# Patient Record
Sex: Female | Born: 1945
Health system: Southern US, Community
[De-identification: ages and names within clinical notes are randomized; demographics above are authoritative.]

## PROBLEM LIST (undated history)

## (undated) DIAGNOSIS — M5410 Radiculopathy, site unspecified: Secondary | ICD-10-CM

## (undated) DIAGNOSIS — N189 Chronic kidney disease, unspecified: Secondary | ICD-10-CM

## (undated) DIAGNOSIS — F32A Depression, unspecified: Secondary | ICD-10-CM

## (undated) DIAGNOSIS — E669 Obesity, unspecified: Secondary | ICD-10-CM

## (undated) DIAGNOSIS — M5412 Radiculopathy, cervical region: Secondary | ICD-10-CM

## (undated) DIAGNOSIS — C9310 Chronic myelomonocytic leukemia not having achieved remission: Secondary | ICD-10-CM

## (undated) DIAGNOSIS — E785 Hyperlipidemia, unspecified: Secondary | ICD-10-CM

## (undated) DIAGNOSIS — F329 Major depressive disorder, single episode, unspecified: Secondary | ICD-10-CM

## (undated) DIAGNOSIS — I4891 Unspecified atrial fibrillation: Secondary | ICD-10-CM

## (undated) DIAGNOSIS — I10 Essential (primary) hypertension: Secondary | ICD-10-CM

## (undated) HISTORY — DX: Chronic kidney disease, unspecified: N18.9

## (undated) HISTORY — DX: Hyperlipidemia, unspecified: E78.5

## (undated) HISTORY — DX: Obesity, unspecified: E66.9

## (undated) HISTORY — PX: KNEE SURGERY: SHX244

## (undated) HISTORY — PX: ABDOMINAL HYSTERECTOMY: SHX81

## (undated) HISTORY — PX: CHOLECYSTECTOMY, LAPAROSCOPIC: SHX56

## (undated) HISTORY — DX: Radiculopathy, cervical region: M54.12

## (undated) HISTORY — DX: Essential (primary) hypertension: I10

## (undated) HISTORY — DX: Depression, unspecified: F32.A

## (undated) HISTORY — DX: Unspecified atrial fibrillation: I48.91

## (undated) HISTORY — DX: Radiculopathy, site unspecified: M54.10

## (undated) HISTORY — PX: HERNIA REPAIR: SHX51

## (undated) HISTORY — DX: Chronic myelomonocytic leukemia not having achieved remission: C93.10

## (undated) HISTORY — DX: Major depressive disorder, single episode, unspecified: F32.9

---

## 2009-01-28 HISTORY — PX: CARDIOVERSION: SHX1299

## 2009-07-03 ENCOUNTER — Ambulatory Visit: Payer: Self-pay | Admitting: Cardiology

## 2009-07-07 ENCOUNTER — Encounter: Payer: Self-pay | Admitting: Cardiology

## 2009-07-07 ENCOUNTER — Ambulatory Visit: Payer: Self-pay | Admitting: Cardiology

## 2009-07-10 ENCOUNTER — Encounter: Payer: Self-pay | Admitting: Cardiology

## 2009-07-10 ENCOUNTER — Telehealth: Payer: Self-pay | Admitting: Cardiology

## 2009-07-11 ENCOUNTER — Encounter: Payer: Self-pay | Admitting: Cardiology

## 2009-07-14 ENCOUNTER — Encounter: Payer: Self-pay | Admitting: Cardiology

## 2009-07-14 ENCOUNTER — Telehealth: Payer: Self-pay | Admitting: Cardiology

## 2009-07-19 ENCOUNTER — Encounter: Payer: Self-pay | Admitting: Cardiology

## 2009-07-19 LAB — CONVERTED CEMR LAB

## 2009-07-21 ENCOUNTER — Encounter: Payer: Self-pay | Admitting: Cardiology

## 2009-07-25 ENCOUNTER — Encounter: Payer: Self-pay | Admitting: Cardiology

## 2009-07-25 ENCOUNTER — Telehealth: Payer: Self-pay | Admitting: Cardiology

## 2009-07-25 ENCOUNTER — Telehealth (INDEPENDENT_AMBULATORY_CARE_PROVIDER_SITE_OTHER): Payer: Self-pay | Admitting: *Deleted

## 2009-07-25 LAB — CONVERTED CEMR LAB: POC INR: 4.3

## 2009-07-26 ENCOUNTER — Encounter: Payer: Self-pay | Admitting: Cardiology

## 2009-07-31 ENCOUNTER — Encounter: Payer: Self-pay | Admitting: Cardiology

## 2009-08-02 ENCOUNTER — Telehealth: Payer: Self-pay | Admitting: Cardiology

## 2009-08-02 ENCOUNTER — Encounter: Payer: Self-pay | Admitting: Cardiology

## 2009-08-02 LAB — CONVERTED CEMR LAB: POC INR: 3.1

## 2009-08-08 ENCOUNTER — Encounter: Payer: Self-pay | Admitting: Cardiology

## 2009-08-08 DIAGNOSIS — N189 Chronic kidney disease, unspecified: Secondary | ICD-10-CM | POA: Insufficient documentation

## 2009-08-08 DIAGNOSIS — E785 Hyperlipidemia, unspecified: Secondary | ICD-10-CM | POA: Insufficient documentation

## 2009-08-08 DIAGNOSIS — F341 Dysthymic disorder: Secondary | ICD-10-CM | POA: Insufficient documentation

## 2009-08-08 DIAGNOSIS — I4891 Unspecified atrial fibrillation: Secondary | ICD-10-CM | POA: Insufficient documentation

## 2009-08-09 ENCOUNTER — Encounter: Payer: Self-pay | Admitting: Cardiology

## 2009-08-09 ENCOUNTER — Ambulatory Visit: Payer: Self-pay | Admitting: Cardiology

## 2009-08-09 LAB — CONVERTED CEMR LAB: POC INR: 4.4

## 2009-08-14 ENCOUNTER — Encounter (INDEPENDENT_AMBULATORY_CARE_PROVIDER_SITE_OTHER): Payer: Self-pay | Admitting: *Deleted

## 2009-08-14 ENCOUNTER — Encounter: Payer: Self-pay | Admitting: Cardiology

## 2009-08-15 ENCOUNTER — Ambulatory Visit: Payer: Self-pay | Admitting: Cardiology

## 2009-08-15 LAB — CONVERTED CEMR LAB: POC INR: 4.5

## 2009-08-17 ENCOUNTER — Encounter: Payer: Self-pay | Admitting: Cardiology

## 2009-08-18 ENCOUNTER — Encounter: Payer: Self-pay | Admitting: Cardiology

## 2009-08-22 ENCOUNTER — Ambulatory Visit: Payer: Self-pay | Admitting: Cardiology

## 2009-08-25 ENCOUNTER — Ambulatory Visit: Payer: Self-pay | Admitting: Cardiology

## 2009-08-29 ENCOUNTER — Ambulatory Visit: Payer: Self-pay | Admitting: Cardiology

## 2009-09-06 ENCOUNTER — Encounter: Payer: Self-pay | Admitting: Cardiology

## 2009-09-08 ENCOUNTER — Ambulatory Visit: Payer: Self-pay | Admitting: Cardiology

## 2009-09-15 ENCOUNTER — Ambulatory Visit: Payer: Self-pay | Admitting: Cardiology

## 2009-09-15 LAB — CONVERTED CEMR LAB: POC INR: 2.5

## 2009-10-04 ENCOUNTER — Telehealth (INDEPENDENT_AMBULATORY_CARE_PROVIDER_SITE_OTHER): Payer: Self-pay | Admitting: *Deleted

## 2009-10-05 ENCOUNTER — Ambulatory Visit: Payer: Self-pay | Admitting: Cardiology

## 2009-10-05 LAB — CONVERTED CEMR LAB: POC INR: 2.6

## 2009-10-11 ENCOUNTER — Ambulatory Visit: Payer: Self-pay | Admitting: Cardiology

## 2009-10-11 ENCOUNTER — Encounter (INDEPENDENT_AMBULATORY_CARE_PROVIDER_SITE_OTHER): Payer: Self-pay | Admitting: *Deleted

## 2009-10-19 ENCOUNTER — Encounter: Payer: Self-pay | Admitting: Cardiology

## 2009-10-20 ENCOUNTER — Ambulatory Visit: Payer: Self-pay | Admitting: Cardiology

## 2009-10-23 ENCOUNTER — Encounter (INDEPENDENT_AMBULATORY_CARE_PROVIDER_SITE_OTHER): Payer: Self-pay | Admitting: *Deleted

## 2009-10-24 ENCOUNTER — Encounter (INDEPENDENT_AMBULATORY_CARE_PROVIDER_SITE_OTHER): Payer: Self-pay | Admitting: *Deleted

## 2009-10-27 ENCOUNTER — Ambulatory Visit: Payer: Self-pay | Admitting: Cardiology

## 2009-11-17 ENCOUNTER — Ambulatory Visit: Payer: Self-pay | Admitting: Cardiology

## 2009-12-19 ENCOUNTER — Ambulatory Visit: Payer: Self-pay | Admitting: Cardiology

## 2010-01-16 ENCOUNTER — Ambulatory Visit: Payer: Self-pay | Admitting: Cardiology

## 2010-02-13 ENCOUNTER — Ambulatory Visit: Admission: RE | Admit: 2010-02-13 | Discharge: 2010-02-13 | Payer: Self-pay | Source: Home / Self Care

## 2010-02-13 LAB — CONVERTED CEMR LAB: POC INR: 2.9

## 2010-02-27 NOTE — Progress Notes (Signed)
Summary: Changing appt to [place]  Phone Note Call from Patient Call back at [facility] Phone [phone]   Caller: Patient Reason for Call: Talk to Nurse Details for Reason: CCR In [place]??? Summary of Call: she would like to come to [place] tomorrow and get her coumadin checked intsead of Friday in [place].. She can not keep her appt on Friday. Initial call taken by: Claudette Laws,  October 04, 2009 2:04 PM  Follow-up for Phone Call        Appt changed to 10/05/09 at 3pm in [place].  Pt told and agreeable. Follow-up by: Vashti Hey RN,  October 04, 2009 2:36 PM

## 2010-02-27 NOTE — Progress Notes (Signed)
Summary: coumadin management  Phone Note Other Incoming   Caller: Alexis Frock, RN [facility] Reason for Call: Discuss lab or test results Summary of Call: Received call with results of INR obtained on pt today.  INR 3.7  Order given for pt hold coumadin tonight then decrease dose to 7.5mg  once daily.  Pt to stop Lovenox.  [facility] to recheck INR on 07/14/09 Initial call taken by: Vashti Hey RN,  July 10, 2009 11:50 AM     Anticoagulant Therapy  Managed by: Vashti Hey, RN Supervising MD: Dietrich Pates MD, Molly Maduro Indication 1: Atrial Fibrillation Lab Used: [facility] [place] Site: [place] INR POC 3.7  Dietary changes: no    Health status changes: no    Bleeding/hemorrhagic complications: no    Recent/future hospitalizations: no    Any changes in medication regimen? no    Recent/future dental: no  Any missed doses?: no       Is patient compliant with meds? yes         Anticoagulation Management History:      Her anticoagulation is being managed by telephone today.  Positive risk factors for bleeding include an age of 4 years or older.  The bleeding index is 'intermediate risk'.  Negative CHADS2 values include Age > 87 years old.  Anticoagulation responsible provider: Dietrich Pates MD, Molly Maduro.  INR POC: 3.7.    Anticoagulation Management Assessment/Plan:      The target INR is 2.0-3.0.  The next INR is due 07/14/2009.  Anticoagulation instructions were given to patient.  Results were reviewed/authorized by Vashti Hey, RN.  She was notified by Vashti Hey RN.         Prior Anticoagulation Instructions: INR 1.4 Increase comadin to 10mg  once daily  Continue Lovenox 150mg  subcutaneously [facility] to recheck INR on 07/10/09  Current Anticoagulation Instructions: Received call with results of INR obtained on pt today.  INR 3.7  Order given for pt hold coumadin tonight then decrease dose to 7.5mg  once daily.  Pt to stop Lovenox.  [facility] to recheck INR on 07/14/09

## 2010-02-27 NOTE — Medication Information (Signed)
Summary: ccr - pending dccv next week  --agh  Anticoagulant Therapy  Managed by: Vashti Hey, RN PCP: Lynden Oxford Supervising MD: Myrtis Ser MD, Tinnie Gens Indication 1: Atrial Fibrillation Lab Used: [facility] [place] Site: [place] INR POC 4.5 INR RANGE 2.0-3.0  Dietary changes: no    Health status changes: no    Bleeding/hemorrhagic complications: no    Recent/future hospitalizations: yes       Details: pending DCCV  Any changes in medication regimen? no    Recent/future dental: no  Any missed doses?: no       Is patient compliant with meds? yes       Allergies: No Known Drug Allergies  Anticoagulation Management History:      The patient is taking warfarin and comes in today for a routine follow up visit.  Positive risk factors for bleeding include an age of 67 years or older.  The bleeding index is 'intermediate risk'.  Negative CHADS2 values include Age > 34 years old.  Anticoagulation responsible provider: Myrtis Ser MD, Tinnie Gens.  INR POC: 4.5.  Cuvette Lot#: [id number].    Anticoagulation Management Assessment/Plan:      The patient's current anticoagulation dose is Coumadin 5 mg tabs: use as directed.  The target INR is 2.0-3.0.  The next INR is due 08/25/2009.  Anticoagulation instructions were given to patient.  Results were reviewed/authorized by Vashti Hey, RN.  She was notified by Vashti Hey RN.        Coagulation management information includes:  DCCV 08/22/09.  Prior Anticoagulation Instructions: INR 3.1 Continue coumadin 5mg  once daily except 7.5mg  on Sundays  Current Anticoagulation Instructions: Pt states INR was checked last week at [facility] on 7/13  INR 4 something.   Was told to take 2.5mg  that day then decrease dose to 5mg  once daily ( was not documented) INR 4.5 today Hold coumadin tonight then resume 5mg  once daily  Pending DCCV on 7/26

## 2010-02-27 NOTE — Miscellaneous (Signed)
Summary: Home Care Report/ [facility]  Home Care Report/ [facility]   Imported By: Dorise Hiss 07/10/2009 11:56:56  _____________________________________________________________________  External Attachment:    Type:   Image     Comment:   External Document
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
## 2010-02-27 NOTE — Letter (Signed)
Summary: Engineer, materials at [facility]  [address]   [place], [place] [address]   Phone: [phone]  Fax: [phone]        October 24, 2009 MRN: [id number]   Christina Bentley [address] [place], [place]  [address]   Dear Ms. Janczak,  Your test ordered by Selena Batten has been reviewed by your physician (or physician assistant) and was found to be normal or stable. Your physician (or physician assistant) felt no changes were needed at this time.  ____ Echocardiogram  __X__ Cardiac Stress Test  ____ Lab Work  ____ Peripheral vascular study of arms, legs or neck  ____ CT scan or X-ray  ____ Lung or Breathing test  ____ Other:   Thank you.   Hoover Brunette, LPN    Duane Boston, M.D., F.A.C.C. Thressa Sheller, M.D., F.A.C.C. Oneal Grout, M.D., F.A.C.C. Cheree Ditto, M.D., F.A.C.C. Daiva Nakayama, M.D., F.A.C.C. Kenney Houseman, M.D., F.A.C.C. Jeanne Ivan, PA-C

## 2010-02-27 NOTE — Medication Information (Signed)
Summary: ccr-lr  Anticoagulant Therapy  Managed by: Vashti Hey, RN PCP: Lynden Oxford Supervising MD: Diona Browner MD, Remi Deter Indication 1: Atrial Fibrillation Lab Used: [facility] [place] Site: [place] INR POC 3.4 INR RANGE 2.0-3.0  Dietary changes: no    Health status changes: no    Bleeding/hemorrhagic complications: no    Recent/future hospitalizations: no    Any changes in medication regimen? no    Recent/future dental: no  Any missed doses?: no       Is patient compliant with meds? yes       Allergies: No Known Drug Allergies  Anticoagulation Management History:      The patient is taking warfarin and comes in today for a routine follow up visit.  Positive risk factors for bleeding include an age of 67 years or older.  The bleeding index is 'intermediate risk'.  Negative CHADS2 values include Age > 88 years old.  Anticoagulation responsible provider: Diona Browner MD, Remi Deter.  INR POC: 3.4.  Cuvette Lot#: [id number].    Anticoagulation Management Assessment/Plan:      The patient's current anticoagulation dose is Coumadin 5 mg tabs: use as directed.  The target INR is 2.0-3.0.  The next INR is due 11/17/2009.  Anticoagulation instructions were given to patient.  Results were reviewed/authorized by Vashti Hey, RN.  She was notified by Vashti Hey RN.        Coagulation management information includes:  DCCV 08/22/09 Pt likes blood on the thinner side due to CVA Hx in family.  Prior Anticoagulation Instructions: INR 2.6 Increase coumadin to 5mg  once daily except 2.5mg  on Sundays Dose increased because pt feels more comfortable with INR's around 3.0  Current Anticoagulation Instructions: INR 3.4 Pt likes blood on the thinner side due to CVA Hx in family Continue coumadin 5mg  once daily except 2.5mg  on Sundays Increase greens

## 2010-02-27 NOTE — Medication Information (Signed)
Summary: ccr-lr  Anticoagulant Therapy  Managed by: Vashti Hey, RN PCP: Lynden Oxford Supervising MD: Diona Browner MD, Remi Deter Indication 1: Atrial Fibrillation Lab Used: [facility] [place] Site: [place] INR POC 2.9 INR RANGE 2.0-3.0  Dietary changes: no    Health status changes: no    Bleeding/hemorrhagic complications: no    Recent/future hospitalizations: no    Any changes in medication regimen? no    Recent/future dental: no  Any missed doses?: no       Is patient compliant with meds? yes       Allergies: No Known Drug Allergies  Anticoagulation Management History:      The patient is taking warfarin and comes in today for a routine follow up visit.  Positive risk factors for bleeding include an age of 71 years or older.  The bleeding index is 'intermediate risk'.  Negative CHADS2 values include Age > 100 years old.  Anticoagulation responsible Doniel Maiello: Diona Browner MD, Remi Deter.  INR POC: 2.9.  Cuvette Lot#: [id number].    Anticoagulation Management Assessment/Plan:      The patient's current anticoagulation dose is Coumadin 5 mg tabs: use as directed.  The target INR is 2.0-3.0.  The next INR is due 01/16/2010.  Anticoagulation instructions were given to patient.  Results were reviewed/authorized by Vashti Hey, RN.  She was notified by Vashti Hey RN.         Prior Anticoagulation Instructions: INR 2.8 Continue coumadin 5mg  once daily except 2.5mg  on Sundays  Current Anticoagulation Instructions: INR 2.9 Continue coumadin 5mg  once daily except 2.5mg  on Sundays

## 2010-02-27 NOTE — Letter (Signed)
Summary: Engineer, materials at [facility]  [address]   [place], [place] [address]   Phone: [phone]  Fax: [phone]        October 23, 2009 MRN: [id number]   Christina Bentley [address] [place], [place]  [address]   Dear Ms. Lawless,  Your test ordered by Selena Batten has been reviewed by your physician (or physician assistant) and was found to be normal or stable. Your physician (or physician assistant) felt no changes were needed at this time.  ____ Echocardiogram  ____ Cardiac Stress Test  __X__ Lab Work  ____ Peripheral vascular study of arms, legs or neck  ____ CT scan or X-ray  ____ Lung or Breathing test  ____ Other:   Thank you.   Hoover Brunette, LPN    Duane Boston, M.D., F.A.C.C. Thressa Sheller, M.D., F.A.C.C. Oneal Grout, M.D., F.A.C.C. Cheree Ditto, M.D., F.A.C.C. Daiva Nakayama, M.D., F.A.C.C. Kenney Houseman, M.D., F.A.C.C. Jeanne Ivan, PA-C

## 2010-02-27 NOTE — Progress Notes (Signed)
Summary: coumadin management  Phone Note Other Incoming   Caller: Alexis Frock RN [facility] Reason for Call: Discuss lab or test results Summary of Call: INR 4.3 today Hold coumadin tonight then decrease dose to 5mg  once daily except 7.5mg  on Sundays.  Recheck INR 08/01/09. Initial call taken by: Vashti Hey RN,  July 25, 2009 11:10 AM      Anticoagulation Management History:      Her anticoagulation is being managed by telephone today.  Positive risk factors for bleeding include an age of 33 years or older.  The bleeding index is 'intermediate risk'.  Negative CHADS2 values include Age > 40 years old.  Anticoagulation responsible provider: Andee Lineman MD, Michelle Piper.  INR POC: 4.3.    Anticoagulation Management Assessment/Plan:      The target INR is 2.0-3.0.  The next INR is due 08/01/2009.  Anticoagulation instructions were given to [facility]  Results were reviewed/authorized by Vashti Hey, RN.  She was notified by [facility].         Prior Anticoagulation Instructions: INR 4.5  Spoke with Byrd Hesselbach with [facility].  Skip today and tomorrow's dose of Coumadin then continue same dose of 1 1/2 tablet every day except 1 tablet on Monday and Friday.  Recheck in 1 week.   Current Anticoagulation Instructions: INR 4.3 Hold coumadin tonight then decrease dose to 5mg  once daily except 7.5mg  on Sundays.   Anticoagulant Therapy  Managed by: Vashti Hey, RN Supervising MD: Andee Lineman MD, Michelle Piper Indication 1: Atrial Fibrillation Lab Used: [facility] [place] Site: [place] INR POC 4.3 INR RANGE 2.0-3.0  Dietary changes: no    Health status changes: no    Bleeding/hemorrhagic complications: no    Recent/future hospitalizations: no    Any changes in medication regimen? no    Recent/future dental: no  Any missed doses?: no       Is patient compliant with meds? yes

## 2010-02-27 NOTE — Progress Notes (Signed)
Summary: Weakness w/AFib  Phone Note Other Incoming   Summary of Call: Christina Bentley with [facility] called stating pt seems to be in A.Fib with HR of 107. She is c/o weakness. Pt has first post hosp appt with Dr. Earnestine Leys on 7/13. Christina Bentley notified Dr. Earnestine Leys will be notified of pt's concerns but pt should go to ER for concerning symptoms. Otherwise, she will be evaulated at [facility] on 7/13. Christina Bentley verbalized understanding. Initial call taken by: Cyril Loosen, RN, BSN,  July 25, 2009 10:44 AM  Follow-up for Phone Call        agree. Follow-up by: Lewayne Bunting, MD, [facility],  July 25, 2009 6:15 PM

## 2010-02-27 NOTE — Medication Information (Signed)
Summary: ccr-lr  Anticoagulant Therapy  Managed by: Vashti Hey, RN PCP: Lynden Oxford Supervising MD: Myrtis Ser MD, Tinnie Gens Indication 1: Atrial Fibrillation Lab Used: [facility] [place] Site: [place] INR POC 2.6 INR RANGE 2.0-3.0  Dietary changes: no    Health status changes: no    Bleeding/hemorrhagic complications: no    Recent/future hospitalizations: no    Any changes in medication regimen? no    Recent/future dental: no  Any missed doses?: no       Is patient compliant with meds? yes       Allergies: No Known Drug Allergies  Anticoagulation Management History:      The patient is taking warfarin and comes in today for a routine follow up visit.  Positive risk factors for bleeding include an age of 67 years or older.  The bleeding index is 'intermediate risk'.  Negative CHADS2 values include Age > 26 years old.  Anticoagulation responsible Eliam Snapp: Myrtis Ser MD, Tinnie Gens.  INR POC: 2.6.  Cuvette Lot#: [id number].    Anticoagulation Management Assessment/Plan:      The patient's current anticoagulation dose is Coumadin 5 mg tabs: use as directed.  The target INR is 2.0-3.0.  The next INR is due 09/15/2009.  Anticoagulation instructions were given to patient.  Results were reviewed/authorized by Vashti Hey, RN.  She was notified by Vashti Hey RN.         Prior Anticoagulation Instructions: INR 3.5 Decrease coumadin to 5mg  once daily except 2.5mg  on S,T,Th  Current Anticoagulation Instructions: INR 2.6 Increase coumadin to 5mg  once daily except 2.5mg  on Sundays and Thursdays Per pt request.  She likes INR around 3.0 cause she is scared of a CVA

## 2010-02-27 NOTE — Medication Information (Signed)
Summary: ccr-lr  Anticoagulant Therapy  Managed by: Vashti Hey, RN PCP: Lynden Oxford Supervising MD: Diona Browner MD, Remi Deter Indication 1: Atrial Fibrillation Lab Used: [facility] [place] Site: [place] INR POC 3.5 INR RANGE 2.0-3.0           Allergies: No Known Drug Allergies  Anticoagulation Management History:      The patient is taking warfarin and comes in today for a routine follow up visit.  Positive risk factors for bleeding include an age of 67 years or older.  The bleeding index is 'intermediate risk'.  Negative CHADS2 values include Age > 67 years old.  Anticoagulation responsible provider: Diona Browner MD, Remi Deter.  INR POC: 3.5.  Cuvette Lot#: [id number].    Anticoagulation Management Assessment/Plan:      The patient's current anticoagulation dose is Coumadin 5 mg tabs: use as directed.  The target INR is 2.0-3.0.  The next INR is due 09/08/2009.  Anticoagulation instructions were given to patient.  Results were reviewed/authorized by Vashti Hey, RN.  She was notified by Vashti Hey RN.         Prior Anticoagulation Instructions: INR 4.1 Hold coumadin tonight then decrease dose to 5mg  once daily except 2.5mg  on Mondays  Current Anticoagulation Instructions: INR 3.5 Decrease coumadin to 5mg  once daily except 2.5mg  on S,T,Th

## 2010-02-27 NOTE — Medication Information (Signed)
Summary: [facility]  Anticoagulant Therapy  Managed by: Weston Brass, PharmD Supervising MD: Andee Lineman MD, Michelle Piper Indication 1: Atrial Fibrillation Lab Used: [facility] [place] Site: [place] INR POC 4.5 INR RANGE 2.0-3.0  Dietary changes: no    Health status changes: no    Bleeding/hemorrhagic complications: yes       Details: pt had cut on arm over weekend that bled more than usual. She was concerned so [facility] RN took INR today and it was elevated.   Recent/future hospitalizations: no    Any changes in medication regimen? no    Recent/future dental: no  Any missed doses?: no       Is patient compliant with meds? yes       Anticoagulation Management History:      Her anticoagulation is being managed by telephone today.  Positive risk factors for bleeding include an age of 68 years or older.  The bleeding index is 'intermediate risk'.  Negative CHADS2 values include Age > 19 years old.  Anticoagulation responsible provider: Andee Lineman MD, Michelle Piper.  INR POC: 4.5.    Anticoagulation Management Assessment/Plan:      The target INR is 2.0-3.0.  The next INR is due 07/25/2009.  Anticoagulation instructions were given to [facility].  Results were reviewed/authorized by Weston Brass, PharmD.  She was notified by Weston Brass PharmD.         Prior Anticoagulation Instructions: Called with results of PT/INR obtained on pt today.  PT 36.2  INR 3.0  Order given for pt to decrease coumadin to 7.5mg  once daily except 5mg  on Mondays and Fridays and [facility] to recheck INR on 07/24/09.  Current Anticoagulation Instructions: INR 4.5  Spoke with Byrd Hesselbach with [facility].  Skip today and tomorrow's dose of Coumadin then continue same dose of 1 1/2 tablet every day except 1 tablet on Monday and Friday.  Recheck in 1 week.

## 2010-02-27 NOTE — Miscellaneous (Signed)
Summary: Home Care Report/ [facility]  Home Care Report/ [facility]   Imported By: Dorise Hiss 07/25/2009 08:57:01  _____________________________________________________________________  External Attachment:    Type:   Image     Comment:   External Document

## 2010-02-27 NOTE — Letter (Signed)
Summary: Lexiscan or Dobutamine Pharmacist, community at [facility]  [address]   [place], [place] [address]   Phone: [phone]  Fax: [phone]      [facility] Cardiovascular Services  Lexiscan or Dobutamine Cardiolite Strss Test    Christina Bentley  Appointment Date:_  Appointment Time:_  Your doctor has ordered a CARDIOLITE STRESS TEST using a medication to stimulate exercise so that you will not have to walk on the treadmill to determine the condition of your heart during stress. If you take blood pressure medication, ask your doctor if you should take it the day of your test. You should not have anything to eat or drink at least 4 hours before your test is scheduled, and no caffeine, including decaffeinated tea and coffee, chocolate, and soft drinks for 24 hours before your test.  You will need to register at the Outpatient/Main Entrance at the hospital 15 minutes before your appointment time. It is a good idea to bring a copy of your order with you. They will direct you to the Diagnostic Imaging (Radiology) Department.  You will be asked to undress from the waist up and given a hospital gown to wear, so dress comfortably from the waist down for example: Sweat pants, shorts, or skirt Rubber soled lace up shoes (tennis shoes)  Plan on about three hours from registration to release from the hospital

## 2010-02-27 NOTE — Medication Information (Signed)
Summary: NEW CCR D/C [facility] 07-05-2009 PER GENE VS  Anticoagulant Therapy  Managed by: Vashti Hey, RN Supervising MD: Diona Browner MD, Remi Deter Indication 1: Atrial Fibrillation Lab Used: [facility] [place] Site: [place] INR POC 1.4  Dietary changes: no    Health status changes: no    Bleeding/hemorrhagic complications: no    Recent/future hospitalizations: yes       Details: In [facility] 07/03/09 - 07/05/09 for atrial fib  new onset  Any changes in medication regimen? yes       Details: She was started on coumadin in the hospital with lovenox bridge.  She has 5mg  tablet and was d/c on 7.5mg  qd   She is on Lovenox 150mg  sq qd   Husband is giving shots.  [facility] is seeing pt.  Recent/future dental: no  Any missed doses?: no       Is patient compliant with meds? yes      Comments: Coumadin teaching done in the hospital.  Pt's questions answered.  Anticoagulation Management History:      The patient comes in today for her initial visit for anticoagulation therapy.  Positive risk factors for bleeding include an age of 67 years or older.  The bleeding index is 'intermediate risk'.  Negative CHADS2 values include Age > 67 years old.  Anticoagulation responsible provider: Diona Browner MD, Remi Deter.  INR POC: 1.4.  Cuvette Lot#: [id number].    Anticoagulation Management Assessment/Plan:      The target INR is 2.0-3.0.  The next INR is due 07/10/2009.  Anticoagulation instructions were given to patient.  Results were reviewed/authorized by Vashti Hey, RN.  She was notified by Vashti Hey RN.         Current Anticoagulation Instructions: INR 1.4 Increase comadin to 10mg  once daily  Continue Lovenox 150mg  subcutaneously [facility] to recheck INR on 07/10/09

## 2010-02-27 NOTE — Consult Note (Signed)
Summary: CARDIOLOGY CONSULT/ [facility]  CARDIOLOGY CONSULT/ [facility]   Imported By: Zachary George 08/08/2009 16:54:24  _____________________________________________________________________  External Attachment:    Type:   Image     Comment:   External Document

## 2010-02-27 NOTE — Medication Information (Signed)
Summary: ccr-lr  Anticoagulant Therapy  Managed by: Vashti Hey, RN PCP: Lynden Oxford Supervising MD: Dietrich Pates MD, Molly Maduro Indication 1: Atrial Fibrillation Lab Used: [facility] [place] Site: [place] INR POC 2.6 INR RANGE 2.0-3.0  Dietary changes: no    Health status changes: no    Bleeding/hemorrhagic complications: no    Recent/future hospitalizations: no    Any changes in medication regimen? no    Recent/future dental: no  Any missed doses?: no       Is patient compliant with meds? yes       Allergies: No Known Drug Allergies  Anticoagulation Management History:      The patient is taking warfarin and comes in today for a routine follow up visit.  Positive risk factors for bleeding include an age of 67 years or older.  The bleeding index is 'intermediate risk'.  Negative CHADS2 values include Age > 14 years old.  Anticoagulation responsible provider: Dietrich Pates MD, Molly Maduro.  INR POC: 2.6.  Cuvette Lot#: [id number].    Anticoagulation Management Assessment/Plan:      The patient's current anticoagulation dose is Coumadin 5 mg tabs: use as directed.  The target INR is 2.0-3.0.  The next INR is due 10/27/2009.  Anticoagulation instructions were given to patient.  Results were reviewed/authorized by Vashti Hey, RN.  She was notified by Vashti Hey RN.         Prior Anticoagulation Instructions: INR 2.5 Continue coumadin 5mg  once daily except 2.5mg  on Sundays and Thursdays   Current Anticoagulation Instructions: INR 2.6 Increase coumadin to 5mg  once daily except 2.5mg  on Sundays Dose increased because pt feels more comfortable with INR's around 3.0
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
## 2010-02-27 NOTE — Miscellaneous (Signed)
Summary: Home Care Report/ [facility]  Home Care Report/ [facility]   Imported By: Dorise Hiss 08/07/2009 09:03:29  _____________________________________________________________________  External Attachment:    Type:   Image     Comment:   External Document

## 2010-02-27 NOTE — Miscellaneous (Signed)
Summary: Home Care Report/ [facility]  Home Care Report/ [facility]   Imported By: Dorise Hiss 09/13/2009 15:58:16  _____________________________________________________________________  External Attachment:    Type:   Image     Comment:   External Document

## 2010-02-27 NOTE — Miscellaneous (Signed)
Summary: Home Care Report/ [facility]  Home Care Report/ [facility]   Imported By: Dorise Hiss 08/09/2009 16:47:51  _____________________________________________________________________  External Attachment:    Type:   Image     Comment:   External Document

## 2010-02-27 NOTE — Miscellaneous (Signed)
Summary: Home Care Report/ [facility]  Home Care Report/ [facility]   Imported By: Dorise Hiss 07/27/2009 16:15:20  _____________________________________________________________________  External Attachment:    Type:   Image     Comment:   External Document

## 2010-02-27 NOTE — Medication Information (Signed)
Summary: ccr-lr  Anticoagulant Therapy  Managed by: Vashti Hey, RN PCP: Lynden Oxford Supervising MD: Diona Browner MD, Remi Deter Indication 1: Atrial Fibrillation Lab Used: [facility] [place] Site: [place] INR POC 4.1 INR RANGE 2.0-3.0  Dietary changes: no    Health status changes: no    Bleeding/hemorrhagic complications: no    Recent/future hospitalizations: yes       Details: S/P DCCV 08/22/09  Any changes in medication regimen? no    Recent/future dental: no  Any missed doses?: no       Is patient compliant with meds? yes       Allergies: No Known Drug Allergies  Anticoagulation Management History:      The patient is taking warfarin and comes in today for a routine follow up visit.  Positive risk factors for bleeding include an age of 67 years or older.  The bleeding index is 'intermediate risk'.  Negative CHADS2 values include Age > 91 years old.  Anticoagulation responsible provider: Diona Browner MD, Remi Deter.  INR POC: 4.1.  Cuvette Lot#: [id number].    Anticoagulation Management Assessment/Plan:      The patient's current anticoagulation dose is Coumadin 5 mg tabs: use as directed.  The target INR is 2.0-3.0.  The next INR is due 08/29/2009.  Anticoagulation instructions were given to patient.  Results were reviewed/authorized by Vashti Hey, RN.  She was notified by Vashti Hey RN.         Prior Anticoagulation Instructions: Pt states INR was checked last week at [facility] on 7/13  INR 4 something.   Was told to take 2.5mg  that day then decrease dose to 5mg  once daily ( was not documented) INR 4.5 today Hold coumadin tonight then resume 5mg  once daily  Pending DCCV on 7/26  Current Anticoagulation Instructions: INR 4.1 Hold coumadin tonight then decrease dose to 5mg  once daily except 2.5mg  on Mondays

## 2010-02-27 NOTE — Letter (Signed)
Summary: Discharge Summary  Discharge Summary   Imported By: Dorise Hiss 08/09/2009 09:11:59  _____________________________________________________________________  External Attachment:    Type:   Image     Comment:   External Document

## 2010-02-27 NOTE — Progress Notes (Signed)
Summary: coumadin management  Phone Note Other Incoming   Caller: Okey Regal RN [facility] Reason for Call: Discuss lab or test results Summary of Call: Called with results of PT/INR obtained on pt today.  PT 36.2  INR 3.0  Order given for pt to decrease coumadin to 7.5mg  once daily except 5mg  on Mondays and Fridays and [facility] to recheck INR on 07/24/09. Initial call taken by: Vashti Hey RN,  July 14, 2009 4:05 PM     Anticoagulant Therapy  Managed by: Vashti Hey, RN Supervising MD: Diona Browner MD, Remi Deter Indication 1: Atrial Fibrillation Lab Used: [facility] [place] Site: [place] PT 36.2 INR POC 3.0  Dietary changes: no    Health status changes: no    Bleeding/hemorrhagic complications: no    Recent/future hospitalizations: no    Any changes in medication regimen? no    Recent/future dental: no  Any missed doses?: no       Is patient compliant with meds? yes         Anticoagulation Management History:      Her anticoagulation is being managed by telephone today.  Positive risk factors for bleeding include an age of 67 years or older.  The bleeding index is 'intermediate risk'.  Negative CHADS2 values include Age > 67 years old.  Prothrombin time is 36.2.  Anticoagulation responsible provider: Diona Browner MD, Remi Deter.  INR POC: 3.0.    Anticoagulation Management Assessment/Plan:      The target INR is 2.0-3.0.  The next INR is due 07/24/2009.  Anticoagulation instructions were given to [facility].  Results were reviewed/authorized by Vashti Hey, RN.  She was notified by [facility].         Prior Anticoagulation Instructions: Received call with results of INR obtained on pt today.  INR 3.7  Order given for pt hold coumadin tonight then decrease dose to 7.5mg  once daily.  Pt to stop Lovenox.  [facility] to recheck INR on 07/14/09  Current Anticoagulation Instructions: Called with results of PT/INR obtained on pt today.  PT 36.2  INR 3.0  Order given for pt to decrease coumadin to  7.5mg  once daily except 5mg  on Mondays and Fridays and [facility] to recheck INR on 07/24/09.

## 2010-02-27 NOTE — Medication Information (Signed)
Summary: Christina Bentley  Anticoagulant Therapy  Managed by: Vashti Hey, RN PCP: Lynden Oxford Supervising MD: Andee Lineman MD, Michelle Piper Indication 1: Atrial Fibrillation Lab Used: [facility] [place] Site: [place] INR POC 2.5 INR RANGE 2.0-3.0  Dietary changes: no    Health status changes: no    Bleeding/hemorrhagic complications: no    Recent/future hospitalizations: no    Any changes in medication regimen? no    Recent/future dental: no  Any missed doses?: no       Is patient compliant with meds? yes       Allergies: No Known Drug Allergies  Anticoagulation Management History:      The patient is taking warfarin and comes in today for a routine follow up visit.  Positive risk factors for bleeding include an age of 11 years or older.  The bleeding index is 'intermediate risk'.  Negative CHADS2 values include Age > 36 years old.  Anticoagulation responsible Jamaurion Slemmer: Andee Lineman MD, Michelle Piper.  INR POC: 2.5.  Cuvette Lot#: [id number].    Anticoagulation Management Assessment/Plan:      The patient's current anticoagulation dose is Coumadin 5 mg tabs: use as directed.  The target INR is 2.0-3.0.  The next INR is due 10/06/2009.  Anticoagulation instructions were given to patient.  Results were reviewed/authorized by Vashti Hey, RN.  She was notified by Vashti Hey RN.         Prior Anticoagulation Instructions: INR 2.6 Increase coumadin to 5mg  once daily except 2.5mg  on Sundays and Thursdays Per pt request.  She likes INR around 3.0 cause she is scared of a CVA  Current Anticoagulation Instructions: INR 2.5 Continue coumadin 5mg  once daily except 2.5mg  on Sundays and Thursdays

## 2010-02-27 NOTE — Letter (Signed)
Summary: Cardioversion/TEE Cardioversion Letter  Architectural technologist at [facility]   [place], [place] [address]   Phone: [phone]  Fax: [phone]    08/14/2009 MRN: [id number]  Christina Bentley [address] [place], [place]  [address]    You are scheduled for a Cardioversion on Tuesday, July 26 at 11:30 with Dr. Andee Lineman.   Please arrive at [facility] at [facility] at 9:30 a.m. on the day of your procedure.  1)   DIET:  A)  Nothing to eat or drink after midnight except your medications with a sip of water.  B)   May have clear liquid breakfast, then nothing to eat or drink after _________ a.m. / p.m.      Clear liquids include:  water, broth, Sprite, Ginger Ale, black coffee, tea (no sugar),      cranberry / grape / apple juice, jello (not red), popsicle from clear juices (not red).  2)   [facility]______ at ____________ a.m./p.m. for your pre-procedure visit.  3)  MAKE SURE YOU TAKE YOUR COUMADIN.  4)   A)   DO NOT TAKE these medications before your procedure:      ___________________________________________________________________     ___________________________________________________________________     ___________________________________________________________________  B)   YOU MAY TAKE ALL of your remaining medications with a small amount of water.    C)   START NEW medications:       ___________________________________________________________________     ___________________________________________________________________  5)   Must have a responsible person to drive you home.  6)   Bring a current list of your medications and current insurance cards.   * Special Note:  Every effort is made to have your procedure done on time. Occasionally there are emergencies that present themselves at the hospital that may cause delays. Please be patient if a delay does occur.  * If you have any questions after you get home,  please call the office at [phone].

## 2010-02-27 NOTE — Miscellaneous (Signed)
Summary: Home Care Report/ [facility]  Home Care Report/ [facility]   Imported By: Dorise Hiss 07/18/2009 13:51:02  _____________________________________________________________________  External Attachment:    Type:   Image     Comment:   External Document

## 2010-02-27 NOTE — Progress Notes (Signed)
Summary: coumadin management  Phone Note From Other Clinic Call back at [phone]   Caller: Noreene Larsson with [facility]  Call For: nurse Summary of Call: message left on voicemail from homehealth nurse that patient refusing INR this week due to primary nurse off this week and patient refusing to let any other nurse do INR. Patient states she was coming to our coumadin clinic this week to have it checked. Nurse called homehealth nurse back and informed her that patient doesn't have an appointment at our [facility] clinic this week and she needed this since she was pending cardioversion per [facility]. Homehealth nurse informed nurse that she would have her supervisor call patient back and reinforce importance or getting INR checked weekly due to pending cardioversion.  Initial call taken by: Carlye Grippe,  August 02, 2009 4:43 PM  Follow-up for Phone Call        Home Health nurse called with results of INR obtained on pt today.  INR 3.1  Order given for pt to continue coumadin 5mg  once daily except 7.5mg  on Sundays.  [facility] discharged pt today.  Next INR will be in the office on 08/09/09 at appt with Dr Andee Lineman. Follow-up by: Vashti Hey RN,  August 03, 2009 3:18 PM     Anticoagulant Therapy  Managed by: Vashti Hey, RN Supervising MD: Diona Browner MD, Remi Deter Indication 1: Atrial Fibrillation Lab Used: [facility] [place] Site: [place] INR POC 3.1 INR RANGE 2.0-3.0  Dietary changes: no    Health status changes: no    Bleeding/hemorrhagic complications: no    Recent/future hospitalizations: yes       Details: ? pending DCCV  Any changes in medication regimen? no    Recent/future dental: no  Any missed doses?: no       Is patient compliant with meds? yes         Anticoagulation Management History:      Her anticoagulation is being managed by telephone today.  Positive risk factors for bleeding include an age of 67 years or older.  The bleeding index is 'intermediate risk'.   Negative CHADS2 values include Age > 67 years old.  Anticoagulation responsible provider: Diona Browner MD, Remi Deter.  INR POC: 3.1.    Anticoagulation Management Assessment/Plan:      The patient's current anticoagulation dose is Coumadin 5 mg tabs: use as directed.  The target INR is 2.0-3.0.  The next INR is due 08/09/2009.  Anticoagulation instructions were given to [facility].  Results were reviewed/authorized by Vashti Hey, RN.  She was notified by [facility].        Coagulation management information includes: ? DCCV.  Prior Anticoagulation Instructions: INR 4.3 Hold coumadin tonight then decrease dose to 5mg  once daily except 7.5mg  on Sundays.  Current Anticoagulation Instructions: INR 3.1 Continue coumadin 5mg  once daily except 7.5mg  on Sundays

## 2010-02-27 NOTE — Medication Information (Signed)
Summary: ccr-lr  Anticoagulant Therapy  Managed by: Vashti Hey, RN PCP: Lynden Oxford Supervising MD: Andee Lineman MD, Michelle Piper Indication 1: Atrial Fibrillation Lab Used: [facility] [place] Site: [place] INR POC 2.8 INR RANGE 2.0-3.0  Dietary changes: no    Health status changes: no    Bleeding/hemorrhagic complications: no    Recent/future hospitalizations: no    Any changes in medication regimen? no    Recent/future dental: no  Any missed doses?: no       Is patient compliant with meds? yes       Allergies: No Known Drug Allergies  Anticoagulation Management History:      The patient is taking warfarin and comes in today for a routine follow up visit.  Positive risk factors for bleeding include an age of 67 years or older.  The bleeding index is 'intermediate risk'.  Negative CHADS2 values include Age > 7 years old.  Anticoagulation responsible provider: Andee Lineman MD, Michelle Piper.  INR POC: 2.8.  Cuvette Lot#: [id number].    Anticoagulation Management Assessment/Plan:      The patient's current anticoagulation dose is Coumadin 5 mg tabs: use as directed.  The target INR is 2.0-3.0.  The next INR is due 12/15/2009.  Anticoagulation instructions were given to patient.  Results were reviewed/authorized by Vashti Hey, RN.  She was notified by Vashti Hey RN.         Prior Anticoagulation Instructions: INR 3.4 Pt likes blood on the thinner side due to CVA Hx in family Continue coumadin 5mg  once daily except 2.5mg  on Sundays Increase greens   Current Anticoagulation Instructions: INR 2.8 Continue coumadin 5mg  once daily except 2.5mg  on Sundays

## 2010-02-27 NOTE — Miscellaneous (Signed)
Summary: Orders Update - DCCV  Clinical Lists Changes  Orders: Added new Referral order of Cardioversion (Cardioversion) - Signed Added new Test order of T-Basic Metabolic Panel ([id number]) - Signed Added new Test order of T-Protime, Auto ([id number]) - Signed 

## 2010-02-27 NOTE — Medication Information (Signed)
Summary: Coumadin Clinic  Anticoagulant Therapy  Managed by: Hoover Brunette, LPN PCP: Lynden Oxford Supervising MD: Diona Browner MD, Remi Deter Indication 1: Atrial Fibrillation Lab Used: [facility] [place] Site: [place] INR POC 4.4 INR RANGE 2.0-3.0  Dietary changes: no    Health status changes: no    Bleeding/hemorrhagic complications: no    Recent/future hospitalizations: yes       Details: pending DCCV  Any changes in medication regimen? no    Recent/future dental: no  Any missed doses?: no       Is patient compliant with meds? yes       Allergies: No Known Drug Allergies  Anticoagulation Management History:      The patient is taking warfarin and comes in today for a routine follow up visit.  Positive risk factors for bleeding include an age of 67 years or older.  The bleeding index is 'intermediate risk'.  Negative CHADS2 values include Age > 42 years old.  Anticoagulation responsible provider: Myrtis Ser MD, Tinnie Gens.  INR POC: 4.5.  Cuvette Lot#: [id number].    Anticoagulation Management Assessment/Plan:      The patient's current anticoagulation dose is Coumadin 5 mg tabs: use as directed.  The target INR is 2.0-3.0.  The next INR is due 08/25/2009.  Anticoagulation instructions were given to patient.  Results were reviewed/authorized by Vashti Hey, RN.  She was notified by Hoover Brunette, LPN.         Prior Anticoagulation Instructions: Pt states INR was checked last week at [facility] on 7/13  INR 4 something.   Was told to take 2.5mg  that day then decrease dose to 5mg  once daily ( was not documented) INR 4.5 today Hold coumadin tonight then resume 5mg  once daily  Pending DCCV on 7/26  Current Anticoagulation Instructions: INR 4.4 Pt to take coumadin 2.5mg  tonight then resume 5mg  once daily

## 2010-03-01 NOTE — Medication Information (Signed)
Summary: ccr-lr  Anticoagulant Therapy  Managed by: Vashti Hey, RN PCP: Lynden Oxford Supervising MD: Andee Lineman MD, Michelle Piper Indication 1: Atrial Fibrillation Lab Used: [facility] [place] Site: [place] INR POC 2.9 INR RANGE 2.0-3.0  Dietary changes: no    Health status changes: no    Bleeding/hemorrhagic complications: no    Recent/future hospitalizations: no    Any changes in medication regimen? no    Recent/future dental: no  Any missed doses?: no       Is patient compliant with meds? yes       Allergies: No Known Drug Allergies  Anticoagulation Management History:      The patient is taking warfarin and comes in today for a routine follow up visit.  Positive risk factors for bleeding include an age of 67 years or older.  The bleeding index is 'intermediate risk'.  Negative CHADS2 values include Age > 67 years old.  Anticoagulation responsible T[place]on Enyeart: Andee Lineman MD, Michelle Piper.  INR POC: 2.9.  Cuvette Lot#: [id number].    Anticoagulation Management Assessment/Plan:      The patient's current anticoagulation dose is Coumadin 5 mg tabs: use as directed.  The target INR is 2.0-3.0.  The next INR is due 02/13/2010.  Anticoagulation instructions were given to patient.  Results were reviewed/authorized by Vashti Hey, RN.  She was notified by Vashti Hey RN.         Prior Anticoagulation Instructions: INR 2.9 Continue coumadin 5mg  once daily except 2.5mg  on Sundays  Current Anticoagulation Instructions: Same as Prior Instructions.

## 2010-03-01 NOTE — Medication Information (Signed)
Summary: ccr-lr  Anticoagulant Therapy  Managed by: Vashti Hey, RN PCP: Lynden Oxford Supervising MD: Diona Browner MD, Remi Deter Indication 1: Atrial Fibrillation Lab Used: [facility] [place] Site: [place] INR POC 2.9 INR RANGE 2.0-3.0  Dietary changes: no    Health status changes: no    Bleeding/hemorrhagic complications: no    Recent/future hospitalizations: no    Any changes in medication regimen? no    Recent/future dental: no  Any missed doses?: no       Is patient compliant with meds? yes       Allergies: No Known Drug Allergies  Anticoagulation Management History:      The patient is taking warfarin and comes in today for a routine follow up visit.  Positive risk factors for bleeding include an age of 30 years or older.  The bleeding index is 'intermediate risk'.  Negative CHADS2 values include Age > 55 years old.  Anticoagulation responsible provider: Diona Browner MD, Remi Deter.  INR POC: 2.9.  Cuvette Lot#: [id number].    Anticoagulation Management Assessment/Plan:      The patient's current anticoagulation dose is Coumadin 5 mg tabs: use as directed.  The target INR is 2.0-3.0.  The next INR is due 03/13/2010.  Anticoagulation instructions were given to patient.  Results were reviewed/authorized by Vashti Hey, RN.  She was notified by Vashti Hey RN.         Prior Anticoagulation Instructions: INR 2.9 Continue coumadin 5mg  once daily except 2.5mg  on Sundays  Current Anticoagulation Instructions: Same as Prior Instructions.

## 2010-03-09 ENCOUNTER — Telehealth (INDEPENDENT_AMBULATORY_CARE_PROVIDER_SITE_OTHER): Payer: Self-pay | Admitting: *Deleted

## 2010-03-13 ENCOUNTER — Encounter (INDEPENDENT_AMBULATORY_CARE_PROVIDER_SITE_OTHER): Payer: Medicare Other

## 2010-03-13 ENCOUNTER — Encounter: Payer: Self-pay | Admitting: Cardiology

## 2010-03-13 DIAGNOSIS — I4891 Unspecified atrial fibrillation: Secondary | ICD-10-CM

## 2010-03-13 DIAGNOSIS — Z7901 Long term (current) use of anticoagulants: Secondary | ICD-10-CM

## 2010-03-13 LAB — CONVERTED CEMR LAB: POC INR: 3.1

## 2010-03-15 NOTE — Progress Notes (Signed)
Summary: Med change  Phone Note Call from Patient   Reason for Call: Talk to Nurse Summary of Call: Saw Dr Teresa Pelton for leg pain and he started her on Hydrocodone 7.5/325mg  for pain.  Discussed possible interactions with this but strength has smaller amt of tylenol.  She will take as needed and has appt for INR on 03/13/10.  May need lap. surgery in the future on her knee.  Is pending MRI. Initial call taken by: Vashti Hey RN,  March 09, 2010 5:20 PM

## 2010-03-20 ENCOUNTER — Telehealth (INDEPENDENT_AMBULATORY_CARE_PROVIDER_SITE_OTHER): Payer: Self-pay | Admitting: *Deleted

## 2010-03-21 NOTE — Medication Information (Signed)
Summary: ccr-lr  Anticoagulant Therapy  Managed by: Vashti Hey, RN PCP: Lynden Oxford Supervising MD: Diona Browner MD, Remi Deter Indication 1: Atrial Fibrillation Lab Used: [facility] [place] Site: [place] INR POC 3.1 INR RANGE 2.0-3.0  Dietary changes: no    Health status changes: no    Bleeding/hemorrhagic complications: no    Recent/future hospitalizations: no    Any changes in medication regimen? no    Recent/future dental: no  Any missed doses?: no       Is patient compliant with meds? yes       Allergies: No Known Drug Allergies  Anticoagulation Management History:      The patient is taking warfarin and comes in today for a routine follow up visit.  Positive risk factors for bleeding include an age of 28 years or older.  The bleeding index is 'intermediate risk'.  Negative CHADS2 values include Age > 60 years old.  Anticoagulation responsible provider: Diona Browner MD, Remi Deter.  INR POC: 3.1.  Cuvette Lot#: [id number].    Anticoagulation Management Assessment/Plan:      The patient's current anticoagulation dose is Coumadin 5 mg tabs: use as directed.  The target INR is 2.0-3.0.  The next INR is due 04/13/2010.  Anticoagulation instructions were given to patient.  Results were reviewed/authorized by Vashti Hey, RN.  She was notified by Vashti Hey RN.         Prior Anticoagulation Instructions: INR 2.9 Continue coumadin 5mg  once daily except 2.5mg  on Sundays  Current Anticoagulation Instructions: INR 3.1 Continue coumadin 5mg  once daily except 2.5mg  on Sundays

## 2010-03-27 NOTE — Progress Notes (Signed)
Summary: arthroscopic knee surgery  Phone Note Call from Patient   Reason for Call: Talk to Nurse Summary of Call: Called to let me know Dr Teresa Pelton has scheduled her for arthroscopic knee surgey on 03/27/10.  He told her to stop her coumadin on 03/20/10.  She will resume after surgery with f/u INR as planned.  Encouraged pt to be as mobile as possible after surgery. Initial call taken by: Vashti Hey RN,  March 20, 2010 10:40 AM

## 2010-04-13 ENCOUNTER — Encounter: Payer: Self-pay | Admitting: Cardiology

## 2010-04-13 ENCOUNTER — Encounter (INDEPENDENT_AMBULATORY_CARE_PROVIDER_SITE_OTHER): Payer: Medicare Other

## 2010-04-13 DIAGNOSIS — Z7901 Long term (current) use of anticoagulants: Secondary | ICD-10-CM

## 2010-04-13 DIAGNOSIS — I4891 Unspecified atrial fibrillation: Secondary | ICD-10-CM

## 2010-04-13 LAB — CONVERTED CEMR LAB: POC INR: 2.7

## 2010-04-17 NOTE — Medication Information (Signed)
Summary: ccr-lr  Anticoagulant Therapy  Managed by: Vashti Hey, RN PCP: Lynden Oxford Supervising MD: Andee Lineman MD, Michelle Piper Indication 1: Atrial Fibrillation Lab Used: [facility] [place] Site: [place] INR POC 2.7 INR RANGE 2.0-3.0  Dietary changes: no    Health status changes: no    Bleeding/hemorrhagic complications: no    Recent/future hospitalizations: no    Any changes in medication regimen? no    Recent/future dental: no  Any missed doses?: no       Is patient compliant with meds? yes       Allergies: No Known Drug Allergies  Anticoagulation Management History:      The patient is taking warfarin and comes in today for a routine follow up visit.  Positive risk factors for bleeding include an age of 67 years or older.  The bleeding index is 'intermediate risk'.  Negative CHADS2 values include Age > 67 years old.  Anticoagulation responsible provider: Andee Lineman MD, Michelle Piper.  INR POC: 2.7.  Cuvette Lot#: [id number].    Anticoagulation Management Assessment/Plan:      The patient's current anticoagulation dose is Coumadin 5 mg tabs: use as directed.  The target INR is 2.0-3.0.  The next INR is due 05/08/2010.  Anticoagulation instructions were given to patient.  Results were reviewed/authorized by Vashti Hey, RN.  She was notified by Vashti Hey RN.         Prior Anticoagulation Instructions: INR 3.1 Continue coumadin 5mg  once daily except 2.5mg  on Sundays  Current Anticoagulation Instructions: INR 2.7 Continue coumadin 5mg  once daily except 2.5mg  on Sundays

## 2010-05-07 ENCOUNTER — Encounter: Payer: Self-pay | Admitting: Cardiology

## 2010-05-07 ENCOUNTER — Other Ambulatory Visit: Payer: Self-pay | Admitting: Cardiology

## 2010-05-07 DIAGNOSIS — Z7901 Long term (current) use of anticoagulants: Secondary | ICD-10-CM

## 2010-05-07 DIAGNOSIS — I4891 Unspecified atrial fibrillation: Secondary | ICD-10-CM

## 2010-05-08 ENCOUNTER — Ambulatory Visit (INDEPENDENT_AMBULATORY_CARE_PROVIDER_SITE_OTHER): Payer: Medicare Other | Admitting: *Deleted

## 2010-05-08 DIAGNOSIS — I4891 Unspecified atrial fibrillation: Secondary | ICD-10-CM

## 2010-05-08 DIAGNOSIS — Z7901 Long term (current) use of anticoagulants: Secondary | ICD-10-CM

## 2010-06-02 ENCOUNTER — Other Ambulatory Visit: Payer: Self-pay | Admitting: Cardiology

## 2010-06-05 ENCOUNTER — Other Ambulatory Visit: Payer: Self-pay | Admitting: Cardiology

## 2010-06-05 ENCOUNTER — Ambulatory Visit (INDEPENDENT_AMBULATORY_CARE_PROVIDER_SITE_OTHER): Payer: Medicare Other | Admitting: *Deleted

## 2010-06-05 DIAGNOSIS — Z7901 Long term (current) use of anticoagulants: Secondary | ICD-10-CM

## 2010-06-05 DIAGNOSIS — I4891 Unspecified atrial fibrillation: Secondary | ICD-10-CM

## 2010-06-17 ENCOUNTER — Other Ambulatory Visit: Payer: Self-pay | Admitting: Cardiology

## 2010-06-18 ENCOUNTER — Other Ambulatory Visit: Payer: Self-pay | Admitting: Cardiology

## 2010-06-21 ENCOUNTER — Ambulatory Visit (INDEPENDENT_AMBULATORY_CARE_PROVIDER_SITE_OTHER): Payer: Medicare Other | Admitting: Cardiology

## 2010-06-21 ENCOUNTER — Ambulatory Visit (INDEPENDENT_AMBULATORY_CARE_PROVIDER_SITE_OTHER): Payer: Medicare Other | Admitting: *Deleted

## 2010-06-21 ENCOUNTER — Encounter: Payer: Self-pay | Admitting: Cardiology

## 2010-06-21 DIAGNOSIS — I4891 Unspecified atrial fibrillation: Secondary | ICD-10-CM

## 2010-06-21 DIAGNOSIS — N189 Chronic kidney disease, unspecified: Secondary | ICD-10-CM

## 2010-06-21 DIAGNOSIS — E785 Hyperlipidemia, unspecified: Secondary | ICD-10-CM

## 2010-06-21 DIAGNOSIS — Z7901 Long term (current) use of anticoagulants: Secondary | ICD-10-CM

## 2010-06-21 DIAGNOSIS — R5383 Other fatigue: Secondary | ICD-10-CM | POA: Insufficient documentation

## 2010-06-21 MED ORDER — DILTIAZEM HCL ER COATED BEADS 240 MG PO CP24: 240.0000 mg | ORAL_CAPSULE | Freq: Every day | ORAL | Status: DC

## 2010-06-21 MED ORDER — WARFARIN SODIUM 5 MG PO TABS: ORAL_TABLET | ORAL | Status: DC

## 2010-06-21 NOTE — Patient Instructions (Signed)
Continue all current medications. Your physician wants you to follow up in:  1 year.  You will receive a reminder letter in the mail one-two months in advance.  If you don't receive a letter, please call our office to schedule the follow up appointment   

## 2010-06-21 NOTE — Assessment & Plan Note (Signed)
She tolerates anticoagulation. At this point no change in therapy is indicated.

## 2010-06-21 NOTE — Assessment & Plan Note (Signed)
We discussed at length her fatigue and possible etiologies. This is very likely at least in part related to decreased sleep. We discussed significant sleep strategies.
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
## 2010-06-21 NOTE — Assessment & Plan Note (Signed)
This was stable at the last appt.  I will defer follow up to TAPPER,DAVID B, MD

## 2010-06-21 NOTE — Assessment & Plan Note (Signed)
We have a very long discussion about her obesity.  For a change the patient was the one to initiate this discussion.  She wants to start the Atkins diet. I think her weight is such a significant problem that I would encourage this. We discussed long-term sustainable strategies as well.

## 2010-06-22 ENCOUNTER — Encounter: Payer: Medicare Other | Admitting: *Deleted

## 2010-06-22 ENCOUNTER — Ambulatory Visit: Payer: Medicare Other | Admitting: Cardiology

## 2010-06-22 LAB — POCT INR: INR: 3.3

## 2010-07-06 ENCOUNTER — Ambulatory Visit (INDEPENDENT_AMBULATORY_CARE_PROVIDER_SITE_OTHER): Payer: Medicare Other | Admitting: *Deleted

## 2010-07-06 DIAGNOSIS — Z7901 Long term (current) use of anticoagulants: Secondary | ICD-10-CM

## 2010-07-06 DIAGNOSIS — I4891 Unspecified atrial fibrillation: Secondary | ICD-10-CM

## 2010-07-06 LAB — POCT INR: INR: 2.8

## 2010-08-03 ENCOUNTER — Ambulatory Visit (INDEPENDENT_AMBULATORY_CARE_PROVIDER_SITE_OTHER): Payer: Medicare Other | Admitting: *Deleted

## 2010-08-03 DIAGNOSIS — I4891 Unspecified atrial fibrillation: Secondary | ICD-10-CM

## 2010-08-03 DIAGNOSIS — Z7901 Long term (current) use of anticoagulants: Secondary | ICD-10-CM

## 2010-08-03 LAB — POCT INR: INR: 3.1

## 2010-08-31 ENCOUNTER — Encounter: Payer: Medicare Other | Admitting: *Deleted

## 2010-09-10 ENCOUNTER — Other Ambulatory Visit: Payer: Self-pay | Admitting: Cardiology

## 2010-09-10 ENCOUNTER — Other Ambulatory Visit: Payer: Self-pay | Admitting: *Deleted

## 2010-09-10 DIAGNOSIS — I4891 Unspecified atrial fibrillation: Secondary | ICD-10-CM

## 2010-09-10 MED ORDER — WARFARIN SODIUM 5 MG PO TABS: ORAL_TABLET | ORAL | Status: DC

## 2010-09-14 ENCOUNTER — Ambulatory Visit (INDEPENDENT_AMBULATORY_CARE_PROVIDER_SITE_OTHER): Payer: Medicare Other | Admitting: *Deleted

## 2010-09-14 DIAGNOSIS — I4891 Unspecified atrial fibrillation: Secondary | ICD-10-CM

## 2010-09-14 DIAGNOSIS — Z7901 Long term (current) use of anticoagulants: Secondary | ICD-10-CM

## 2010-09-14 LAB — POCT INR: INR: 2.6

## 2010-10-12 ENCOUNTER — Encounter: Payer: Medicare Other | Admitting: *Deleted

## 2010-10-16 ENCOUNTER — Ambulatory Visit (INDEPENDENT_AMBULATORY_CARE_PROVIDER_SITE_OTHER): Payer: Medicare Other | Admitting: *Deleted

## 2010-10-16 DIAGNOSIS — I4891 Unspecified atrial fibrillation: Secondary | ICD-10-CM

## 2010-10-16 DIAGNOSIS — Z7901 Long term (current) use of anticoagulants: Secondary | ICD-10-CM

## 2010-10-16 LAB — POCT INR: INR: 2.7

## 2010-11-13 ENCOUNTER — Encounter: Payer: Medicare Other | Admitting: *Deleted

## 2010-11-20 ENCOUNTER — Encounter: Payer: Medicare Other | Admitting: *Deleted

## 2010-11-30 ENCOUNTER — Ambulatory Visit (INDEPENDENT_AMBULATORY_CARE_PROVIDER_SITE_OTHER): Payer: Medicare Other | Admitting: *Deleted

## 2010-11-30 DIAGNOSIS — I4891 Unspecified atrial fibrillation: Secondary | ICD-10-CM

## 2010-11-30 DIAGNOSIS — Z7901 Long term (current) use of anticoagulants: Secondary | ICD-10-CM

## 2010-11-30 LAB — POCT INR: INR: 2.2

## 2010-12-28 ENCOUNTER — Encounter: Payer: Medicare Other | Admitting: *Deleted

## 2011-01-08 ENCOUNTER — Ambulatory Visit (INDEPENDENT_AMBULATORY_CARE_PROVIDER_SITE_OTHER): Payer: Medicare Other | Admitting: *Deleted

## 2011-01-08 DIAGNOSIS — Z7901 Long term (current) use of anticoagulants: Secondary | ICD-10-CM

## 2011-01-08 DIAGNOSIS — I4891 Unspecified atrial fibrillation: Secondary | ICD-10-CM

## 2011-01-18 ENCOUNTER — Other Ambulatory Visit: Payer: Self-pay | Admitting: Cardiology

## 2011-02-19 ENCOUNTER — Ambulatory Visit (INDEPENDENT_AMBULATORY_CARE_PROVIDER_SITE_OTHER): Payer: Medicare Other | Admitting: *Deleted

## 2011-02-19 DIAGNOSIS — I4891 Unspecified atrial fibrillation: Secondary | ICD-10-CM

## 2011-02-19 DIAGNOSIS — Z7901 Long term (current) use of anticoagulants: Secondary | ICD-10-CM

## 2011-04-02 ENCOUNTER — Encounter: Payer: Medicare Other | Admitting: *Deleted

## 2011-04-09 ENCOUNTER — Ambulatory Visit (INDEPENDENT_AMBULATORY_CARE_PROVIDER_SITE_OTHER): Payer: Medicare Other | Admitting: *Deleted

## 2011-04-09 DIAGNOSIS — Z7901 Long term (current) use of anticoagulants: Secondary | ICD-10-CM

## 2011-04-09 DIAGNOSIS — I4891 Unspecified atrial fibrillation: Secondary | ICD-10-CM

## 2011-04-29 LAB — PROTIME-INR: INR: 2.2 — AB (ref 0.9–1.1)

## 2011-04-30 ENCOUNTER — Ambulatory Visit (INDEPENDENT_AMBULATORY_CARE_PROVIDER_SITE_OTHER): Payer: Medicare Other | Admitting: *Deleted

## 2011-04-30 DIAGNOSIS — I4891 Unspecified atrial fibrillation: Secondary | ICD-10-CM

## 2011-04-30 DIAGNOSIS — Z7901 Long term (current) use of anticoagulants: Secondary | ICD-10-CM

## 2011-05-21 ENCOUNTER — Ambulatory Visit (INDEPENDENT_AMBULATORY_CARE_PROVIDER_SITE_OTHER): Payer: Medicare Other | Admitting: *Deleted

## 2011-05-21 DIAGNOSIS — I4891 Unspecified atrial fibrillation: Secondary | ICD-10-CM

## 2011-05-21 DIAGNOSIS — Z7901 Long term (current) use of anticoagulants: Secondary | ICD-10-CM

## 2011-06-07 ENCOUNTER — Ambulatory Visit (INDEPENDENT_AMBULATORY_CARE_PROVIDER_SITE_OTHER): Payer: Medicare Other | Admitting: *Deleted

## 2011-06-07 DIAGNOSIS — Z7901 Long term (current) use of anticoagulants: Secondary | ICD-10-CM

## 2011-06-07 DIAGNOSIS — I4891 Unspecified atrial fibrillation: Secondary | ICD-10-CM

## 2011-06-07 LAB — POCT INR: INR: 3

## 2011-06-13 ENCOUNTER — Encounter: Payer: Self-pay | Admitting: Cardiology

## 2011-06-13 ENCOUNTER — Ambulatory Visit (INDEPENDENT_AMBULATORY_CARE_PROVIDER_SITE_OTHER): Payer: Medicare Other | Admitting: Cardiology

## 2011-06-13 VITALS — BP 140/83 | HR 69 | Ht 67.0 in | Wt 313.0 lb

## 2011-06-13 DIAGNOSIS — G4733 Obstructive sleep apnea (adult) (pediatric): Secondary | ICD-10-CM

## 2011-06-13 DIAGNOSIS — I4891 Unspecified atrial fibrillation: Secondary | ICD-10-CM

## 2011-06-13 DIAGNOSIS — R5381 Other malaise: Secondary | ICD-10-CM

## 2011-06-13 DIAGNOSIS — R5383 Other fatigue: Secondary | ICD-10-CM

## 2011-06-13 DIAGNOSIS — Z7901 Long term (current) use of anticoagulants: Secondary | ICD-10-CM

## 2011-06-13 DIAGNOSIS — R0602 Shortness of breath: Secondary | ICD-10-CM

## 2011-06-13 NOTE — Patient Instructions (Signed)
   Echo  Referral to Dr. Andrey Campanile for evaluation of obstructive sleep apnea If the results of your test are normal or stable, you will receive a letter.  If they are abnormal, the nurse will contact you by phone. Your physician wants you to follow up in: 6 months.  You will receive a reminder letter in the mail one-two months in advance.  If you don't receive a letter, please call our office to schedule the follow up appointment

## 2011-06-27 ENCOUNTER — Other Ambulatory Visit (INDEPENDENT_AMBULATORY_CARE_PROVIDER_SITE_OTHER): Payer: Medicare Other

## 2011-06-27 ENCOUNTER — Other Ambulatory Visit: Payer: Self-pay

## 2011-06-27 DIAGNOSIS — R5383 Other fatigue: Secondary | ICD-10-CM

## 2011-06-27 DIAGNOSIS — R0602 Shortness of breath: Secondary | ICD-10-CM

## 2011-06-27 DIAGNOSIS — I4891 Unspecified atrial fibrillation: Secondary | ICD-10-CM

## 2011-06-28 ENCOUNTER — Other Ambulatory Visit: Payer: Self-pay | Admitting: Cardiology

## 2011-07-02 ENCOUNTER — Encounter: Payer: Self-pay | Admitting: *Deleted

## 2011-07-07 DIAGNOSIS — G4733 Obstructive sleep apnea (adult) (pediatric): Secondary | ICD-10-CM | POA: Insufficient documentation
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
## 2011-07-07 NOTE — Assessment & Plan Note (Addendum)
May be related to deconditioning and also patient may have obstructive sleep apnea contributing to her fatigue.we will also check an echocardiogram to make sure that she has normal LV systolic function

## 2011-07-07 NOTE — Assessment & Plan Note (Signed)
Given the patient's history she likely has obstructive sleep apnea and I referred her to Dr. Andrey Campanile for further evaluation.

## 2011-07-07 NOTE — Assessment & Plan Note (Signed)
Tolerating anticoagulation and patient remains compliant.

## 2011-07-07 NOTE — Assessment & Plan Note (Signed)
Patient remains in normal sinus rhythm.  She reports no significant palpitations.  Continue current medical regimen.

## 2011-07-16 ENCOUNTER — Ambulatory Visit (INDEPENDENT_AMBULATORY_CARE_PROVIDER_SITE_OTHER): Payer: Medicare Other | Admitting: *Deleted

## 2011-07-16 DIAGNOSIS — Z7901 Long term (current) use of anticoagulants: Secondary | ICD-10-CM

## 2011-07-16 DIAGNOSIS — I4891 Unspecified atrial fibrillation: Secondary | ICD-10-CM

## 2011-08-06 ENCOUNTER — Encounter: Payer: Medicare Other | Admitting: Internal Medicine

## 2011-08-06 DIAGNOSIS — I4891 Unspecified atrial fibrillation: Secondary | ICD-10-CM

## 2011-08-06 DIAGNOSIS — Z7901 Long term (current) use of anticoagulants: Secondary | ICD-10-CM

## 2011-08-06 DIAGNOSIS — D696 Thrombocytopenia, unspecified: Secondary | ICD-10-CM

## 2011-08-13 ENCOUNTER — Ambulatory Visit (INDEPENDENT_AMBULATORY_CARE_PROVIDER_SITE_OTHER): Payer: Medicare Other | Admitting: *Deleted

## 2011-08-13 DIAGNOSIS — Z7901 Long term (current) use of anticoagulants: Secondary | ICD-10-CM

## 2011-08-13 DIAGNOSIS — I4891 Unspecified atrial fibrillation: Secondary | ICD-10-CM

## 2011-09-09 ENCOUNTER — Other Ambulatory Visit: Payer: Self-pay | Admitting: *Deleted

## 2011-09-09 MED ORDER — FUROSEMIDE 40 MG PO TABS: 40.0000 mg | ORAL_TABLET | Freq: Every day | ORAL | Status: DC

## 2011-09-17 ENCOUNTER — Other Ambulatory Visit: Payer: Self-pay | Admitting: *Deleted

## 2011-09-17 ENCOUNTER — Other Ambulatory Visit: Payer: Self-pay | Admitting: Cardiology

## 2011-09-17 DIAGNOSIS — I4891 Unspecified atrial fibrillation: Secondary | ICD-10-CM

## 2011-09-17 MED ORDER — DILTIAZEM HCL ER COATED BEADS 240 MG PO CP24: 240.0000 mg | ORAL_CAPSULE | Freq: Every day | ORAL | Status: DC

## 2011-09-27 ENCOUNTER — Ambulatory Visit (INDEPENDENT_AMBULATORY_CARE_PROVIDER_SITE_OTHER): Payer: Medicare Other | Admitting: *Deleted

## 2011-09-27 DIAGNOSIS — I4891 Unspecified atrial fibrillation: Secondary | ICD-10-CM

## 2011-09-27 DIAGNOSIS — Z7901 Long term (current) use of anticoagulants: Secondary | ICD-10-CM

## 2011-10-10 ENCOUNTER — Other Ambulatory Visit: Payer: Self-pay | Admitting: Cardiology

## 2011-11-19 ENCOUNTER — Ambulatory Visit (INDEPENDENT_AMBULATORY_CARE_PROVIDER_SITE_OTHER): Payer: Medicare Other | Admitting: *Deleted

## 2011-11-19 ENCOUNTER — Telehealth: Payer: Self-pay | Admitting: Cardiology

## 2011-11-19 DIAGNOSIS — I4891 Unspecified atrial fibrillation: Secondary | ICD-10-CM

## 2011-11-19 DIAGNOSIS — Z7901 Long term (current) use of anticoagulants: Secondary | ICD-10-CM

## 2011-11-19 LAB — POCT INR: INR: 2.1

## 2011-11-19 NOTE — Telephone Encounter (Signed)
GOING WITH DEGENT PER LISA

## 2011-12-29 HISTORY — PX: CARDIOVERSION: SHX1299

## 2012-01-17 ENCOUNTER — Other Ambulatory Visit: Payer: Self-pay | Admitting: Physician Assistant

## 2012-01-19 ENCOUNTER — Other Ambulatory Visit: Payer: Self-pay | Admitting: Cardiology

## 2012-01-29 HISTORY — PX: CARDIAC ELECTROPHYSIOLOGY MAPPING AND ABLATION: SHX1292

## 2012-04-16 ENCOUNTER — Encounter: Payer: Medicare Other | Admitting: Internal Medicine

## 2012-04-16 DIAGNOSIS — D696 Thrombocytopenia, unspecified: Secondary | ICD-10-CM

## 2012-07-01 ENCOUNTER — Other Ambulatory Visit: Payer: Self-pay | Admitting: Cardiology

## 2012-10-04 ENCOUNTER — Other Ambulatory Visit: Payer: Self-pay | Admitting: Cardiology

## 2012-10-12 DIAGNOSIS — D72829 Elevated white blood cell count, unspecified: Secondary | ICD-10-CM

## 2012-10-12 DIAGNOSIS — D696 Thrombocytopenia, unspecified: Secondary | ICD-10-CM

## 2012-10-13 ENCOUNTER — Other Ambulatory Visit: Payer: Self-pay | Admitting: Cardiology

## 2012-10-23 DIAGNOSIS — I4891 Unspecified atrial fibrillation: Secondary | ICD-10-CM

## 2012-10-23 DIAGNOSIS — R7401 Elevation of levels of liver transaminase levels: Secondary | ICD-10-CM

## 2012-10-23 DIAGNOSIS — D509 Iron deficiency anemia, unspecified: Secondary | ICD-10-CM

## 2012-10-23 DIAGNOSIS — D696 Thrombocytopenia, unspecified: Secondary | ICD-10-CM

## 2012-10-23 DIAGNOSIS — D72821 Monocytosis (symptomatic): Secondary | ICD-10-CM

## 2012-10-23 DIAGNOSIS — R161 Splenomegaly, not elsewhere classified: Secondary | ICD-10-CM

## 2012-10-23 DIAGNOSIS — D72829 Elevated white blood cell count, unspecified: Secondary | ICD-10-CM

## 2012-10-23 DIAGNOSIS — R740 Nonspecific elevation of levels of transaminase and lactic acid dehydrogenase [LDH]: Secondary | ICD-10-CM

## 2012-11-23 DIAGNOSIS — Z23 Encounter for immunization: Secondary | ICD-10-CM

## 2012-11-23 DIAGNOSIS — D696 Thrombocytopenia, unspecified: Secondary | ICD-10-CM

## 2012-11-23 DIAGNOSIS — D72829 Elevated white blood cell count, unspecified: Secondary | ICD-10-CM

## 2012-12-01 ENCOUNTER — Ambulatory Visit (INDEPENDENT_AMBULATORY_CARE_PROVIDER_SITE_OTHER): Payer: Medicare Other | Admitting: Internal Medicine

## 2012-12-01 ENCOUNTER — Encounter: Payer: Self-pay | Admitting: Internal Medicine

## 2012-12-01 VITALS — BP 138/74 | HR 62 | Ht 67.0 in | Wt 326.1 lb

## 2012-12-01 DIAGNOSIS — R5381 Other malaise: Secondary | ICD-10-CM

## 2012-12-01 DIAGNOSIS — R5383 Other fatigue: Secondary | ICD-10-CM

## 2012-12-01 DIAGNOSIS — I4891 Unspecified atrial fibrillation: Secondary | ICD-10-CM

## 2012-12-01 MED ORDER — WARFARIN SODIUM 5 MG PO TABS: ORAL_TABLET | ORAL | Status: AC

## 2012-12-01 MED ORDER — DILTIAZEM HCL ER COATED BEADS 240 MG PO CP24: 240.0000 mg | ORAL_CAPSULE | Freq: Every day | ORAL | Status: AC

## 2012-12-01 NOTE — Patient Instructions (Signed)
Your physician recommends that you schedule a follow-up appointment with Christina Bentley.  Your physician wants you to follow-up in: 6 months. You will receive a reminder letter in the mail two months in advance. If you don't receive a letter, please call our office to schedule the follow-up appointment.

## 2012-12-01 NOTE — Progress Notes (Signed)
OFFICE NOTE  Chief Complaint:  Atrial Fibrillation  Primary Care Physician: Louie Boston, MD  HPI:  Christina Bentley is a 67 year old female first diagnosed with atrial fibrillation 3 years ago. During her first episode she became acutely weak and experienced palpitations. She was admitted ot the hospital at [facility] at that time and had a cardioversion after that event. 1 year ago in December she had a similar event and had a 2nd cardioversion. Patient was cared for by Dr. Earnestine Leys through these 2 episodes and he referred her for ablation with Dr. Orson Aloe at [facility] which was completed in January of this year.   Patient states since that time she has only had occasional palpitations lasting less than 30 seconds but none of the weakness she had experienced before. She has had no chest pain, nausea, or diaphoresis although occasionally she will get some mild shortness of breath which gets better with deep breaths. She has experienced some worsening fatigue in the last year but states she is being evaluated by oncology for low platelets as low as 50k and has recently had a bone marrow biopsy.   PMHx:  Past Medical History  Diagnosis Date  . Hypertension   . Depression   . Dyslipidemia   . Obesity   . Chronic kidney disease, unspecified   . Other and unspecified hyperlipidemia   . Atrial fibrillation     Past Surgical History  Procedure Laterality Date  . Abdominal hysterectomy    . Cholecystectomy, laparoscopic    . Hernia repair    . Knee surgery      Right knee arthroscopy  . Cardioversion  2011  . Cardioversion  12/2011  . Cardiac electrophysiology mapping and ablation  01/2012    FAMHx:  No family history on file.  SOCHx:   reports that she quit smoking about 44 years ago. Her smoking use included Cigarettes. She has a .6 pack-year smoking history. She has never used smokeless tobacco. She reports that she drinks alcohol. Her drug history is not on  file.  ALLERGIES:  No Known Allergies  ROS: Pertinent items are noted in HPI.  HOME MEDS: Current Outpatient Prescriptions  Medication Sig Dispense Refill  . acyclovir (ZOVIRAX) 800 MG tablet Take 800 mg by mouth every 8 (eight) hours as needed.      . clonazePAM (KLONOPIN) 0.5 MG tablet Take 0.25 mg by mouth 2 (two) times daily as needed for anxiety.      Marland Kitchen diltiazem (CARDIZEM CD) 240 MG 24 hr capsule Take 1 capsule (240 mg total) by mouth daily.  30 capsule  11  . furosemide (LASIX) 40 MG tablet Take 1 tablet (40 mg total) by mouth daily.  30 tablet  6  . HYDROcodone-acetaminophen (NORCO) 7.5-325 MG per tablet Take 1 tablet by mouth every 6 (six) hours as needed for moderate pain.      Marland Kitchen losartan (COZAAR) 100 MG tablet Take 100 mg by mouth daily.      Marland Kitchen oxybutynin (DITROPAN) 5 MG tablet Take 5 mg by mouth 2 (two) times daily.      Marland Kitchen oxyCODONE-acetaminophen (PERCOCET) 7.5-325 MG per tablet Take 1 tablet by mouth every 6 (six) hours as needed for pain.      . Probiotic Product (PROBIOTIC FORMULA) CAPS Take 1 capsule by mouth daily.        . psyllium (REGULOID) 0.52 G capsule Take 0.52 g by mouth daily.      Marland Kitchen spironolactone (  ALDACTONE) 25 MG tablet Take 25 mg by mouth daily.      . traMADol (ULTRAM) 50 MG tablet Take 50 mg by mouth every 8 (eight) hours as needed.      . traZODone (DESYREL) 150 MG tablet Take 150 mg by mouth at bedtime.        Marland Kitchen venlafaxine (EFFEXOR) 75 MG tablet Take 75 mg by mouth 2 (two) times daily.      Marland Kitchen warfarin (COUMADIN) 5 MG tablet TAKE AS DIRECTED PER COUMADIN CLINIC  30 tablet  0   No current facility-administered medications for this visit.    LABS/IMAGING: No results found for this or any previous visit (from the past 48 hour(s)). No results found.  VITALS: BP 138/74  Pulse 62  Ht 5\' 7"  (1.702 m)  Wt 326 lb 1.6 oz (147.918 kg)  BMI 51.06 kg/m2  EXAM: General appearance: alert and cooperative Neck: no carotid bruit and no JVD Lungs: clear to  auscultation bilaterally Heart: regular rate and rhythm. 1/6 SEM early peaking.  Extremities: 2+ edema bilaterally, venous stasis changes Skin: Skin color, texture, turgor normal. No rashes or lesions Neurologic: Grossly normal  EKG: Normal sinus rhythm. Rate 62. No ischemic changes.   ASSESSMENT: 1. Paroxysmal Atrial Fibrillation on Coumadin. S/p 2 cardioversions and ablation in January 2014.  2. Hypertension 3. Encounter for long term use of anticoagulant 4. Hyperlpidemia (last total cholesterol 208, HDL 39, LDL 127)  PLAN: 1.   Patient currently in normal sinus rhythm. We will continue her diltiazem and warfarin.  2.   We will have patient set up with our pharmacist for INR monitoring.  3.   We will also obtain records from primary care and oncology to gather information on recent fatigue, low platelets 4.   Follow up in 6 months 5.   Patient does snore and has some daytime fatigue and AM headaches. Discussed possible testing for OSA but patient states she would refuse to wear CPAP even if diagnosed so will not pursue further.  6. Have encouraged patient to follow up with PCP to discuss statin use. She states this was stopped within the last year. Based on risk factors, patient would likely benefit from statin use.   Dr. Rennis Golden has seen and evaluated the patient. We have discussed the history, exam, assessment, and plan as noted above. He agrees with management.  Aldine Contes. Marti Sleigh, MD, PGY3 [facility] 12/01/2012 5:35 PM  Pt. Seen and examined. Agree with the NP/PA-C note as written.  Christina Bentley will be establishing care with me for her a-fib, s/p ablation.  She wishes to have her warfarin levels followed by our office. Will notify our pharmacist Belenda Cruise to contact her about this.  Plan to see her back in 6 months.  Chrystie Nose, MD, [facility] Attending Cardiologist [facility]

## 2012-12-04 ENCOUNTER — Encounter: Payer: Self-pay | Admitting: Internal Medicine

## 2012-12-14 ENCOUNTER — Other Ambulatory Visit (HOSPITAL_COMMUNITY): Payer: Self-pay | Admitting: Hematology and Oncology

## 2012-12-14 DIAGNOSIS — R161 Splenomegaly, not elsewhere classified: Secondary | ICD-10-CM

## 2012-12-14 DIAGNOSIS — D696 Thrombocytopenia, unspecified: Secondary | ICD-10-CM

## 2012-12-14 DIAGNOSIS — R918 Other nonspecific abnormal finding of lung field: Secondary | ICD-10-CM

## 2012-12-14 DIAGNOSIS — R599 Enlarged lymph nodes, unspecified: Secondary | ICD-10-CM

## 2012-12-14 DIAGNOSIS — D72821 Monocytosis (symptomatic): Secondary | ICD-10-CM

## 2012-12-14 DIAGNOSIS — D72829 Elevated white blood cell count, unspecified: Secondary | ICD-10-CM

## 2012-12-29 ENCOUNTER — Encounter (HOSPITAL_COMMUNITY)
Admission: RE | Admit: 2012-12-29 | Discharge: 2012-12-29 | Disposition: A | Discharge status: Home / Self Care | Payer: Medicare Other | Source: Ambulatory Visit | Attending: Hematology and Oncology | Admitting: Hematology and Oncology

## 2012-12-29 DIAGNOSIS — D696 Thrombocytopenia, unspecified: Secondary | ICD-10-CM | POA: Insufficient documentation

## 2012-12-29 DIAGNOSIS — Z9089 Acquired absence of other organs: Secondary | ICD-10-CM | POA: Insufficient documentation

## 2012-12-29 DIAGNOSIS — R161 Splenomegaly, not elsewhere classified: Secondary | ICD-10-CM | POA: Insufficient documentation

## 2012-12-29 DIAGNOSIS — J8410 Pulmonary fibrosis, unspecified: Secondary | ICD-10-CM | POA: Insufficient documentation

## 2012-12-29 DIAGNOSIS — R918 Other nonspecific abnormal finding of lung field: Secondary | ICD-10-CM | POA: Insufficient documentation

## 2012-12-29 MED ORDER — FLUDEOXYGLUCOSE F - 18 (FDG) INJECTION
18.5000 | Freq: Once | INTRAVENOUS | Status: AC | PRN
  Administered 2012-12-29: 18.5 via INTRAVENOUS

## 2013-01-01 DIAGNOSIS — C9210 Chronic myeloid leukemia, BCR/ABL-positive, not having achieved remission: Secondary | ICD-10-CM

## 2013-01-01 DIAGNOSIS — I4891 Unspecified atrial fibrillation: Secondary | ICD-10-CM

## 2013-01-01 DIAGNOSIS — D696 Thrombocytopenia, unspecified: Secondary | ICD-10-CM

## 2013-01-12 ENCOUNTER — Ambulatory Visit (INDEPENDENT_AMBULATORY_CARE_PROVIDER_SITE_OTHER): Payer: Medicare Other | Admitting: Pharmacist Clinician (PhC)/ Clinical Pharmacy Specialist

## 2013-01-12 VITALS — BP 132/90 | HR 68

## 2013-01-12 DIAGNOSIS — I4891 Unspecified atrial fibrillation: Secondary | ICD-10-CM

## 2013-01-12 DIAGNOSIS — Z7901 Long term (current) use of anticoagulants: Secondary | ICD-10-CM

## 2013-02-17 ENCOUNTER — Telehealth: Payer: Self-pay | Admitting: Internal Medicine

## 2013-02-17 NOTE — Telephone Encounter (Signed)
Would like to know when is his mother next coumadin check is and if she can go to the clinic in [place] .Marland Kitchen Please Call   Thanks

## 2013-02-17 NOTE — Telephone Encounter (Signed)
Per chart pt was due for INR in early January.  Advised son of phone # to [place] office, he will call and schedule.

## 2013-02-19 ENCOUNTER — Ambulatory Visit (INDEPENDENT_AMBULATORY_CARE_PROVIDER_SITE_OTHER): Payer: Medicare Other | Admitting: *Deleted

## 2013-02-19 DIAGNOSIS — Z5181 Encounter for therapeutic drug level monitoring: Secondary | ICD-10-CM | POA: Insufficient documentation

## 2013-02-19 DIAGNOSIS — Z7901 Long term (current) use of anticoagulants: Secondary | ICD-10-CM

## 2013-02-19 DIAGNOSIS — I4891 Unspecified atrial fibrillation: Secondary | ICD-10-CM

## 2013-02-19 LAB — POCT INR: INR: 2.6

## 2013-03-19 ENCOUNTER — Telehealth: Payer: Self-pay | Admitting: Internal Medicine

## 2013-03-19 ENCOUNTER — Telehealth: Payer: Self-pay | Admitting: Cardiology

## 2013-03-19 ENCOUNTER — Encounter (INDEPENDENT_AMBULATORY_CARE_PROVIDER_SITE_OTHER): Payer: Self-pay

## 2013-03-19 ENCOUNTER — Ambulatory Visit (INDEPENDENT_AMBULATORY_CARE_PROVIDER_SITE_OTHER): Payer: Medicare Other | Admitting: *Deleted

## 2013-03-19 DIAGNOSIS — Z5181 Encounter for therapeutic drug level monitoring: Secondary | ICD-10-CM

## 2013-03-19 DIAGNOSIS — I4891 Unspecified atrial fibrillation: Secondary | ICD-10-CM

## 2013-03-19 DIAGNOSIS — Z7901 Long term (current) use of anticoagulants: Secondary | ICD-10-CM

## 2013-03-19 LAB — POCT INR: INR: 3.7

## 2013-03-19 NOTE — Telephone Encounter (Signed)
Dr. Debara Pickett already copied on this message.  No action taken.

## 2013-03-19 NOTE — Telephone Encounter (Signed)
Pt husband called and stated that pt new gout medication and instructions is allopurinol 100 MG every day.

## 2013-03-19 NOTE — Telephone Encounter (Signed)
Documented in chart.

## 2013-03-19 NOTE — Telephone Encounter (Signed)
Thanks for letting me know!

## 2013-03-19 NOTE — Telephone Encounter (Signed)
Calling to give some information about a recent diagnosis which is AFib and the doctors at [place] [facility] and doctors at [facility] have determine that she has CMML a type of luekemia. She calling to give this information to Dr. Debara Pickett .Marland KitchenPlease call if have any questions .Marland Kitchen

## 2013-04-06 ENCOUNTER — Ambulatory Visit (INDEPENDENT_AMBULATORY_CARE_PROVIDER_SITE_OTHER): Payer: Medicare Other | Admitting: *Deleted

## 2013-04-06 DIAGNOSIS — Z7901 Long term (current) use of anticoagulants: Secondary | ICD-10-CM

## 2013-04-06 DIAGNOSIS — I4891 Unspecified atrial fibrillation: Secondary | ICD-10-CM

## 2013-04-06 DIAGNOSIS — Z5181 Encounter for therapeutic drug level monitoring: Secondary | ICD-10-CM

## 2013-04-06 LAB — POCT INR: INR: 3.2

## 2013-04-15 ENCOUNTER — Telehealth: Payer: Self-pay | Admitting: *Deleted

## 2013-04-15 NOTE — Telephone Encounter (Signed)
Pt has been on allopurinol 100mg  daily.  It was increased to 300mg  daily on 04/15/13.  Told pt to continue current dose of coumadin but INR appt moved up to 3/31.  If elevated then, will decrease coumadin dose.  She verbalized understanding and agreement.

## 2013-05-07 ENCOUNTER — Ambulatory Visit (INDEPENDENT_AMBULATORY_CARE_PROVIDER_SITE_OTHER): Payer: Medicare Other | Admitting: *Deleted

## 2013-05-07 DIAGNOSIS — Z5181 Encounter for therapeutic drug level monitoring: Secondary | ICD-10-CM

## 2013-05-07 DIAGNOSIS — I4891 Unspecified atrial fibrillation: Secondary | ICD-10-CM

## 2013-05-07 DIAGNOSIS — Z7901 Long term (current) use of anticoagulants: Secondary | ICD-10-CM

## 2013-05-07 LAB — POCT INR: INR: 5.1

## 2013-05-14 ENCOUNTER — Ambulatory Visit (INDEPENDENT_AMBULATORY_CARE_PROVIDER_SITE_OTHER): Payer: Medicare Other | Admitting: *Deleted

## 2013-05-14 DIAGNOSIS — Z7901 Long term (current) use of anticoagulants: Secondary | ICD-10-CM

## 2013-05-14 DIAGNOSIS — Z5181 Encounter for therapeutic drug level monitoring: Secondary | ICD-10-CM

## 2013-05-14 DIAGNOSIS — I4891 Unspecified atrial fibrillation: Secondary | ICD-10-CM

## 2013-05-14 LAB — POCT INR: INR: 3.5

## 2013-05-25 ENCOUNTER — Ambulatory Visit (INDEPENDENT_AMBULATORY_CARE_PROVIDER_SITE_OTHER): Payer: Medicare Other | Admitting: *Deleted

## 2013-05-25 DIAGNOSIS — Z5181 Encounter for therapeutic drug level monitoring: Secondary | ICD-10-CM

## 2013-05-25 DIAGNOSIS — Z7901 Long term (current) use of anticoagulants: Secondary | ICD-10-CM

## 2013-05-25 DIAGNOSIS — I4891 Unspecified atrial fibrillation: Secondary | ICD-10-CM

## 2013-05-25 LAB — POCT INR: INR: 4.8

## 2013-06-04 ENCOUNTER — Ambulatory Visit (INDEPENDENT_AMBULATORY_CARE_PROVIDER_SITE_OTHER): Payer: Medicare Other | Admitting: *Deleted

## 2013-06-04 DIAGNOSIS — Z5181 Encounter for therapeutic drug level monitoring: Secondary | ICD-10-CM

## 2013-06-04 DIAGNOSIS — I4891 Unspecified atrial fibrillation: Secondary | ICD-10-CM

## 2013-06-04 DIAGNOSIS — Z7901 Long term (current) use of anticoagulants: Secondary | ICD-10-CM

## 2013-06-04 LAB — POCT INR: INR: 4.1

## 2013-06-18 ENCOUNTER — Ambulatory Visit (INDEPENDENT_AMBULATORY_CARE_PROVIDER_SITE_OTHER): Payer: Medicare Other | Admitting: *Deleted

## 2013-06-18 DIAGNOSIS — Z7901 Long term (current) use of anticoagulants: Secondary | ICD-10-CM

## 2013-06-18 DIAGNOSIS — Z5181 Encounter for therapeutic drug level monitoring: Secondary | ICD-10-CM

## 2013-06-18 DIAGNOSIS — I4891 Unspecified atrial fibrillation: Secondary | ICD-10-CM

## 2013-06-18 LAB — POCT INR: INR: 1.7

## 2013-07-02 ENCOUNTER — Ambulatory Visit (INDEPENDENT_AMBULATORY_CARE_PROVIDER_SITE_OTHER): Payer: Medicare Other | Admitting: *Deleted

## 2013-07-02 DIAGNOSIS — Z7901 Long term (current) use of anticoagulants: Secondary | ICD-10-CM

## 2013-07-02 DIAGNOSIS — I4891 Unspecified atrial fibrillation: Secondary | ICD-10-CM

## 2013-07-02 DIAGNOSIS — Z5181 Encounter for therapeutic drug level monitoring: Secondary | ICD-10-CM

## 2013-07-02 LAB — POCT INR: INR: 2.5

## 2013-07-08 ENCOUNTER — Encounter: Payer: Self-pay | Admitting: Internal Medicine

## 2013-07-08 ENCOUNTER — Ambulatory Visit (INDEPENDENT_AMBULATORY_CARE_PROVIDER_SITE_OTHER): Payer: Medicare Other | Admitting: Internal Medicine

## 2013-07-08 VITALS — BP 111/68 | HR 74 | Ht 67.0 in | Wt 303.9 lb

## 2013-07-08 DIAGNOSIS — G4733 Obstructive sleep apnea (adult) (pediatric): Secondary | ICD-10-CM

## 2013-07-08 DIAGNOSIS — R5383 Other fatigue: Secondary | ICD-10-CM

## 2013-07-08 DIAGNOSIS — I4891 Unspecified atrial fibrillation: Secondary | ICD-10-CM

## 2013-07-08 DIAGNOSIS — R5381 Other malaise: Secondary | ICD-10-CM

## 2013-07-08 DIAGNOSIS — E785 Hyperlipidemia, unspecified: Secondary | ICD-10-CM

## 2013-07-08 DIAGNOSIS — C9210 Chronic myeloid leukemia, BCR/ABL-positive, not having achieved remission: Secondary | ICD-10-CM

## 2013-07-08 NOTE — Patient Instructions (Signed)
Your physician recommends that you schedule a follow-up appointment in: 6 Months   [facility] number is [phone]

## 2013-07-08 NOTE — Progress Notes (Signed)
OFFICE NOTE  Chief Complaint:  Atrial Fibrillation  Primary Care Physician: Deloria Lair, MD  HPI:  Christina Bentley is a 68 year old female first diagnosed with atrial fibrillation 3 years ago. During her first episode she became acutely weak and experienced palpitations. She was admitted ot the hospital at [facility] at that time and had a cardioversion after that event. 1 year ago in December she had a similar event and had a 2nd cardioversion. Patient was cared for by Dr. Lutricia Feil through these 2 episodes and he referred her for ablation with Dr. Koleen Nimrod at [facility] which was completed in January of this year.   Patient states since that time she has only had occasional palpitations lasting less than 30 seconds but none of the weakness she had experienced before. She has had no chest pain, nausea, or diaphoresis although occasionally she will get some mild shortness of breath which gets better with deep breaths. She has experienced some worsening fatigue in the last year but states she is being evaluated by oncology for low platelets as low as 50k and has recently had a bone marrow biopsy.   Christina Bentley returns today for followup. She tells me in the past 6 months she was diagnosed with CML. She is under the care of an oncologist at [place]. At this time there are no plans for any chemotherapy. She's also been struggling with fatigue and recently had a fall at home. It sounds like she slipped on the floor had problems with her balance. She had significant soft tissue injuries and continues to be in pain. She cannot lay in bed and needs to sleep in a lift chair. A lot of her struggles are probably related to her weight which is excessive. Her body mass index is almost 50 kg/m2.  There have been discussions between her and her previous cardiologist about weight loss in the past and she is considered a comprehensive weight management plan including possibly a gastric bypass surgery, but  has been hesitant to do that given her age. Her quality of life, however is reduced significantly by her weight. This is also contributing to her falls, instability, joint pains and overall fatigue and shortness of breath with minimal exertion. Fortunately she has infrequent palpitations and has responded well to ablation.  PMHx:  Past Medical History  Diagnosis Date  . Hypertension   . Depression   . Dyslipidemia   . Obesity   . Chronic kidney disease, unspecified   . Other and unspecified hyperlipidemia   . Atrial fibrillation     Past Surgical History  Procedure Laterality Date  . Abdominal hysterectomy    . Cholecystectomy, laparoscopic    . Hernia repair    . Knee surgery      Right knee arthroscopy  . Cardioversion  2011  . Cardioversion  12/2011  . Cardiac electrophysiology mapping and ablation  01/2012    FAMHx:  No family history on file.  SOCHx:   reports that she quit smoking about 45 years ago. Her smoking use included Cigarettes. She has a .6 pack-year smoking history. She has never used smokeless tobacco. She reports that she drinks alcohol. Her drug history is not on file.  ALLERGIES:  No Known Allergies  ROS: A comprehensive review of systems was negative except for: Constitutional: positive for fatigue Cardiovascular: positive for palpitations Hematologic/lymphatic: positive for CML Musculoskeletal: positive for muscle weakness and myalgias  HOME MEDS: Current Outpatient Prescriptions  Medication Sig  Dispense Refill  . allopurinol (ZYLOPRIM) 300 MG tablet Take 300 mg by mouth daily.      . clonazePAM (KLONOPIN) 1 MG tablet Take 1 mg by mouth at bedtime.      . colchicine 0.6 MG tablet Take 0.6 mg by mouth daily as needed.      . diltiazem (CARDIZEM CD) 240 MG 24 hr capsule Take 1 capsule (240 mg total) by mouth daily.  90 capsule  3  . furosemide (LASIX) 40 MG tablet Take 1 tablet (40 mg total) by mouth daily.  30 tablet  6  . losartan (COZAAR) 100 MG  tablet Take 100 mg by mouth daily.      . Probiotic Product (PROBIOTIC FORMULA) CAPS Take 1 capsule by mouth daily.        . psyllium (REGULOID) 0.52 G capsule Take 0.52 g by mouth daily.      Marland Kitchen spironolactone (ALDACTONE) 25 MG tablet Take 25 mg by mouth daily.      Marland Kitchen venlafaxine (EFFEXOR) 75 MG tablet Take 75 mg by mouth 2 (two) times daily.      Marland Kitchen warfarin (COUMADIN) 5 MG tablet Take per INR  90 tablet  0   No current facility-administered medications for this visit.    LABS/IMAGING: No results found for this or any previous visit (from the past 48 hour(s)). No results found.  VITALS: BP 111/68  Pulse 74  Ht '5\' 7"'  (1.702 m)  Wt 303 lb 14.4 oz (137.848 kg)  BMI 47.59 kg/m2  EXAM: General appearance: alert and cooperative Neck: no carotid bruit and no JVD Lungs: clear to auscultation bilaterally Heart: regular rate and rhythm. 1/6 SEM early peaking.  Extremities: 2+ edema bilaterally, venous stasis changes Skin: Skin color, texture, turgor normal. No rashes or lesions Neurologic: Grossly normal  EKG: Normal sinus rhythm. Rate 74. No ischemic changes.   ASSESSMENT: 1. Paroxysmal Atrial Fibrillation on Coumadin. S/p 2 cardioversions and ablation in January 2014.  2. Hypertension 3. Encounter for long term use of anticoagulant 4. Hyperlpidemia (last total cholesterol 208, HDL 39, LDL 127) 5. Morbid obesity with BMI near 50 kg/m2  PLAN: 1.   She is maintaining sinus rhythm and has occasional palpitations which are well controlled. Her blood pressure has been well-controlled. Her INRs recently have been difficult to control however she says that for a long period of time they were well controlled. She wants to stay on warfarin despite the fact she may have better control on a novel oral anticoagulant. Her cholesterol is reasonable however to be improved. The main issue I see that his remaining pertains to her weight. I do think she would benefit from a comprehensive weight  management program and could be a candidate for bariatric surgery. She has had 2 previous abdominal surgeries including a hernia repair with mesh. She may have some scar tissue. I have given her information and referred her to the bariatric surgery program at [facility]. At minimum she can get more information about weight management and make a more informed decision.  No changes to medications at this time. Plan to see her back in 6 months.  Pixie Casino, MD, [facility] Attending Cardiologist [facility]   07/08/2013 5:22 PM

## 2013-07-23 ENCOUNTER — Ambulatory Visit (INDEPENDENT_AMBULATORY_CARE_PROVIDER_SITE_OTHER): Payer: Medicare Other | Admitting: *Deleted

## 2013-07-23 DIAGNOSIS — I4891 Unspecified atrial fibrillation: Secondary | ICD-10-CM

## 2013-07-23 DIAGNOSIS — I482 Chronic atrial fibrillation, unspecified: Secondary | ICD-10-CM

## 2013-07-23 DIAGNOSIS — Z5181 Encounter for therapeutic drug level monitoring: Secondary | ICD-10-CM

## 2013-07-23 DIAGNOSIS — Z7901 Long term (current) use of anticoagulants: Secondary | ICD-10-CM

## 2013-07-23 LAB — POCT INR: INR: 2.7

## 2013-08-13 ENCOUNTER — Ambulatory Visit (INDEPENDENT_AMBULATORY_CARE_PROVIDER_SITE_OTHER): Payer: Medicare Other | Admitting: *Deleted

## 2013-08-13 DIAGNOSIS — I4891 Unspecified atrial fibrillation: Secondary | ICD-10-CM

## 2013-08-13 DIAGNOSIS — Z5181 Encounter for therapeutic drug level monitoring: Secondary | ICD-10-CM

## 2013-08-13 DIAGNOSIS — Z7901 Long term (current) use of anticoagulants: Secondary | ICD-10-CM

## 2013-08-13 LAB — POCT INR: INR: 2

## 2013-09-14 ENCOUNTER — Ambulatory Visit (INDEPENDENT_AMBULATORY_CARE_PROVIDER_SITE_OTHER): Payer: Medicare Other | Admitting: *Deleted

## 2013-09-14 DIAGNOSIS — Z7901 Long term (current) use of anticoagulants: Secondary | ICD-10-CM

## 2013-09-14 DIAGNOSIS — Z5181 Encounter for therapeutic drug level monitoring: Secondary | ICD-10-CM

## 2013-09-14 DIAGNOSIS — I4891 Unspecified atrial fibrillation: Secondary | ICD-10-CM

## 2013-09-14 LAB — POCT INR: INR: 2.5

## 2013-10-15 ENCOUNTER — Ambulatory Visit (INDEPENDENT_AMBULATORY_CARE_PROVIDER_SITE_OTHER): Payer: Medicare Other | Admitting: *Deleted

## 2013-10-15 DIAGNOSIS — Z7901 Long term (current) use of anticoagulants: Secondary | ICD-10-CM

## 2013-10-15 DIAGNOSIS — Z5181 Encounter for therapeutic drug level monitoring: Secondary | ICD-10-CM

## 2013-10-15 DIAGNOSIS — I4891 Unspecified atrial fibrillation: Secondary | ICD-10-CM

## 2013-10-15 LAB — POCT INR: INR: 4.6

## 2013-10-18 ENCOUNTER — Telehealth: Payer: Self-pay | Admitting: Neurology

## 2013-10-18 NOTE — Telephone Encounter (Signed)
Called and spoke to patient husband and she is scheduled Scheduled with Dr.Yan this coming Friday. 10-22-2013

## 2013-10-18 NOTE — Telephone Encounter (Signed)
Patient's husband calling to get an appointment scheduled soon with Dr. Krista Blue due to patient having problems with hand motion to the point that she cannot grip anything, per patient's husband, it has progressed a lot in 2 weeks. Please return call and advise.

## 2013-10-22 ENCOUNTER — Encounter: Payer: Self-pay | Admitting: Neurology

## 2013-10-22 ENCOUNTER — Other Ambulatory Visit: Payer: Self-pay | Admitting: Neurology

## 2013-10-22 ENCOUNTER — Ambulatory Visit (INDEPENDENT_AMBULATORY_CARE_PROVIDER_SITE_OTHER): Payer: Medicare Other | Admitting: Neurology

## 2013-10-22 VITALS — BP 132/83 | HR 85 | Ht 66.0 in | Wt 320.0 lb

## 2013-10-22 DIAGNOSIS — R5383 Other fatigue: Secondary | ICD-10-CM

## 2013-10-22 DIAGNOSIS — R5382 Chronic fatigue, unspecified: Secondary | ICD-10-CM

## 2013-10-22 DIAGNOSIS — R5381 Other malaise: Secondary | ICD-10-CM

## 2013-10-22 DIAGNOSIS — I4891 Unspecified atrial fibrillation: Secondary | ICD-10-CM

## 2013-10-22 DIAGNOSIS — R269 Unspecified abnormalities of gait and mobility: Secondary | ICD-10-CM

## 2013-10-22 MED ORDER — CELECOXIB 100 MG PO CAPS: 100.0000 mg | ORAL_CAPSULE | Freq: Two times a day (BID) | ORAL | Status: DC
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
## 2013-10-23 LAB — RHEUMATOID FACTOR

## 2013-10-23 LAB — SEDIMENTATION RATE: Sed Rate: 13 mm/hr (ref 0–40)

## 2013-10-23 LAB — ANA W/REFLEX IF POSITIVE: ANA: NEGATIVE

## 2013-10-23 LAB — C-REACTIVE PROTEIN: CRP: 2.1 mg/L (ref 0.0–4.9)

## 2013-10-29 ENCOUNTER — Telehealth: Payer: Self-pay | Admitting: Neurology

## 2013-10-29 NOTE — Telephone Encounter (Signed)
Spoke with patient and informed her of normal blood work, also informed her that Dr Krista Blue or her assistant will get back with her about the pain that she is in and trouble sleeping, patient verbalized understanding.

## 2013-10-29 NOTE — Telephone Encounter (Signed)
Patient calling to request blood work results, and also that she is in a lot of pain that she can hardly sleep and she wants something to be called in for relief. Please return call and advise.

## 2013-11-10 ENCOUNTER — Ambulatory Visit
Admission: RE | Admit: 2013-11-10 | Discharge: 2013-11-10 | Disposition: A | Discharge status: Home / Self Care | Payer: Medicare Other | Source: Ambulatory Visit | Attending: Neurology | Admitting: Neurology

## 2013-11-10 DIAGNOSIS — R5382 Chronic fatigue, unspecified: Secondary | ICD-10-CM

## 2013-11-10 DIAGNOSIS — R269 Unspecified abnormalities of gait and mobility: Secondary | ICD-10-CM

## 2013-11-10 DIAGNOSIS — I4891 Unspecified atrial fibrillation: Secondary | ICD-10-CM

## 2013-11-11 DIAGNOSIS — R269 Unspecified abnormalities of gait and mobility: Secondary | ICD-10-CM

## 2013-11-12 ENCOUNTER — Ambulatory Visit (INDEPENDENT_AMBULATORY_CARE_PROVIDER_SITE_OTHER): Payer: Medicare Other | Admitting: Neurology

## 2013-11-12 ENCOUNTER — Encounter (INDEPENDENT_AMBULATORY_CARE_PROVIDER_SITE_OTHER): Payer: Self-pay

## 2013-11-12 ENCOUNTER — Encounter: Payer: Self-pay | Admitting: Neurology

## 2013-11-12 DIAGNOSIS — M48061 Spinal stenosis, lumbar region without neurogenic claudication: Secondary | ICD-10-CM | POA: Insufficient documentation

## 2013-11-12 DIAGNOSIS — R269 Unspecified abnormalities of gait and mobility: Secondary | ICD-10-CM

## 2013-11-12 DIAGNOSIS — C9210 Chronic myeloid leukemia, BCR/ABL-positive, not having achieved remission: Secondary | ICD-10-CM

## 2013-11-12 DIAGNOSIS — M4806 Spinal stenosis, lumbar region: Secondary | ICD-10-CM

## 2013-11-12 DIAGNOSIS — I482 Chronic atrial fibrillation, unspecified: Secondary | ICD-10-CM

## 2013-11-12 MED ORDER — OXYCODONE-ACETAMINOPHEN 5-325 MG PO TABS: 1.0000 | ORAL_TABLET | Freq: Three times a day (TID) | ORAL | Status: DC | PRN
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
## 2013-11-18 ENCOUNTER — Ambulatory Visit (INDEPENDENT_AMBULATORY_CARE_PROVIDER_SITE_OTHER): Payer: Medicare Other | Admitting: *Deleted

## 2013-11-18 DIAGNOSIS — I4891 Unspecified atrial fibrillation: Secondary | ICD-10-CM

## 2013-11-18 DIAGNOSIS — Z7901 Long term (current) use of anticoagulants: Secondary | ICD-10-CM

## 2013-11-18 DIAGNOSIS — Z5181 Encounter for therapeutic drug level monitoring: Secondary | ICD-10-CM

## 2013-11-18 LAB — POCT INR: INR: 3

## 2013-11-25 ENCOUNTER — Encounter (INDEPENDENT_AMBULATORY_CARE_PROVIDER_SITE_OTHER): Payer: Self-pay | Admitting: Radiology

## 2013-11-25 ENCOUNTER — Ambulatory Visit (INDEPENDENT_AMBULATORY_CARE_PROVIDER_SITE_OTHER): Payer: Medicare Other | Admitting: Neurology

## 2013-11-25 DIAGNOSIS — I482 Chronic atrial fibrillation, unspecified: Secondary | ICD-10-CM

## 2013-11-25 DIAGNOSIS — R269 Unspecified abnormalities of gait and mobility: Secondary | ICD-10-CM

## 2013-11-25 DIAGNOSIS — Z0289 Encounter for other administrative examinations: Secondary | ICD-10-CM

## 2013-11-25 DIAGNOSIS — M4806 Spinal stenosis, lumbar region: Secondary | ICD-10-CM

## 2013-11-25 DIAGNOSIS — R202 Paresthesia of skin: Secondary | ICD-10-CM

## 2013-11-25 DIAGNOSIS — M48061 Spinal stenosis, lumbar region without neurogenic claudication: Secondary | ICD-10-CM

## 2013-11-25 DIAGNOSIS — C9210 Chronic myeloid leukemia, BCR/ABL-positive, not having achieved remission: Secondary | ICD-10-CM

## 2013-11-25 MED ORDER — OXYCODONE-ACETAMINOPHEN 5-325 MG PO TABS: ORAL_TABLET | ORAL | Status: DC

## 2013-11-25 NOTE — Procedures (Signed)
   NCS (NERVE CONDUCTION STUDY) WITH EMG (ELECTROMYOGRAPHY) REPORT   STUDY DATE: November 25 2013 PATIENT NAME: Christina Bentley DOB: [birth year] MRN: [id number]    TECHNOLOGIST: Towana Badger ELECTROMYOGRAPHER: Marcial Pacas M.D.  CLINICAL INFORMATION:   68 year old female, with past medical history of chronic low back pain, presenting with worsening gait difficulty, also with known history of couple tunnel syndrome, presenting with worsening bilateral wrist pain, hand muscle weakness, paresthesia.  FINDINGS: NERVE CONDUCTION STUDY: Bilateral sural sensory responses were absent. Bilateral tibial motor responses were absent. Bilateral tibial H reflexes were absent.   Bilateral peroneal to EDB motor response showed normal C amplitude at distal stimulation side, with normal distal latency, preserved F-wave latency, mildly decreased CMAP amplitude at proximal stimulation side, likely due to technical difficulty from her big body habitus.   Right median sensory response was absent. Right median motor response showed moderately prolonged distal latency, with mildly decreased C map mplitude, conduction velocity, this has worsened in comparison to previous study in May 2013, she had preserved right median motor CMAP amplitude and velocity then.  Right ulnar sensory and motor responses was normal.  NEEDLE ELECTROMYOGRAPHY: Selected needle examination was performed at right upper extremity muscles, right lower extremity muscles, left abductor pollicis brevis.  Bilateral abductor pollicis brevis: Normal insertion activity, no spontaneous activity, enlarged motor unit potential, with mildly decreased recruitment patterns.  Needle examination of right pronator teres, biceps, triceps, deltoid, extensor digitorum communis was normal  There was continued evidence of chronic neuropathic changes involving right tibialis anterior, medial gastrocnemius, vastus lateralis, normal insertion activity, no spontaneous  activity, enlarged complex motor unit potential, with decreased recruitment patterns.  IMPRESSION:   This is an abnormal study. There is electrodiagnostic evidence of mild axonal sensorimotor polyneuropathy, there is also evidence of chronic lumbar sacral radiculopathy. There is evidence of worsening right carpal tunnel syndromes, now severe right carpal tunnel syndromes, with decreased right median motor C map amplitude, this has  progressed, in comparison to previous study in May 2013.   INTERPRETING PHYSICIAN:   Marcial Pacas M.D. Ph.D. [facility] [address] [place], [place] [address] [phone]

## 2013-12-16 ENCOUNTER — Ambulatory Visit (INDEPENDENT_AMBULATORY_CARE_PROVIDER_SITE_OTHER): Payer: Medicare Other | Admitting: *Deleted

## 2013-12-16 DIAGNOSIS — Z5181 Encounter for therapeutic drug level monitoring: Secondary | ICD-10-CM

## 2013-12-16 DIAGNOSIS — Z7901 Long term (current) use of anticoagulants: Secondary | ICD-10-CM

## 2013-12-16 DIAGNOSIS — I4891 Unspecified atrial fibrillation: Secondary | ICD-10-CM

## 2013-12-16 LAB — POCT INR: INR: 2.5

## 2014-01-13 ENCOUNTER — Ambulatory Visit (INDEPENDENT_AMBULATORY_CARE_PROVIDER_SITE_OTHER): Payer: Medicare Other | Admitting: *Deleted

## 2014-01-13 DIAGNOSIS — Z5181 Encounter for therapeutic drug level monitoring: Secondary | ICD-10-CM

## 2014-01-13 DIAGNOSIS — Z7901 Long term (current) use of anticoagulants: Secondary | ICD-10-CM

## 2014-01-13 DIAGNOSIS — I4891 Unspecified atrial fibrillation: Secondary | ICD-10-CM

## 2014-01-13 LAB — POCT INR: INR: 3.2

## 2014-02-10 ENCOUNTER — Ambulatory Visit (INDEPENDENT_AMBULATORY_CARE_PROVIDER_SITE_OTHER): Payer: Medicare Other | Admitting: *Deleted

## 2014-02-10 DIAGNOSIS — I4891 Unspecified atrial fibrillation: Secondary | ICD-10-CM

## 2014-02-10 DIAGNOSIS — Z7901 Long term (current) use of anticoagulants: Secondary | ICD-10-CM

## 2014-02-10 DIAGNOSIS — Z5181 Encounter for therapeutic drug level monitoring: Secondary | ICD-10-CM

## 2014-02-10 LAB — POCT INR: INR: 3.2

## 2014-03-24 ENCOUNTER — Ambulatory Visit (INDEPENDENT_AMBULATORY_CARE_PROVIDER_SITE_OTHER): Payer: Medicare Other | Admitting: *Deleted

## 2014-03-24 DIAGNOSIS — Z7901 Long term (current) use of anticoagulants: Secondary | ICD-10-CM

## 2014-03-24 DIAGNOSIS — Z5181 Encounter for therapeutic drug level monitoring: Secondary | ICD-10-CM

## 2014-03-24 DIAGNOSIS — I480 Paroxysmal atrial fibrillation: Secondary | ICD-10-CM

## 2014-03-24 DIAGNOSIS — I4891 Unspecified atrial fibrillation: Secondary | ICD-10-CM

## 2014-03-24 LAB — POCT INR: INR: 3.3

## 2014-04-14 ENCOUNTER — Ambulatory Visit (INDEPENDENT_AMBULATORY_CARE_PROVIDER_SITE_OTHER): Payer: Medicare Other | Admitting: *Deleted

## 2014-04-14 DIAGNOSIS — I4891 Unspecified atrial fibrillation: Secondary | ICD-10-CM

## 2014-04-14 DIAGNOSIS — Z5181 Encounter for therapeutic drug level monitoring: Secondary | ICD-10-CM | POA: Diagnosis not present

## 2014-04-14 DIAGNOSIS — Z7901 Long term (current) use of anticoagulants: Secondary | ICD-10-CM

## 2014-04-14 LAB — POCT INR: INR: 3

## 2014-05-12 ENCOUNTER — Ambulatory Visit (INDEPENDENT_AMBULATORY_CARE_PROVIDER_SITE_OTHER): Payer: Medicare Other | Admitting: *Deleted

## 2014-05-12 DIAGNOSIS — I4891 Unspecified atrial fibrillation: Secondary | ICD-10-CM

## 2014-05-12 DIAGNOSIS — Z5181 Encounter for therapeutic drug level monitoring: Secondary | ICD-10-CM

## 2014-05-12 DIAGNOSIS — Z7901 Long term (current) use of anticoagulants: Secondary | ICD-10-CM

## 2014-05-12 DIAGNOSIS — I480 Paroxysmal atrial fibrillation: Secondary | ICD-10-CM

## 2014-05-12 LAB — POCT INR: INR: 3.8

## 2014-06-06 ENCOUNTER — Telehealth: Payer: Self-pay | Admitting: *Deleted

## 2014-06-06 NOTE — Telephone Encounter (Signed)
Spoke with husband.  Pt was started on Septra DS last Monday by Dr Tressie Stalker for UTI.  Dr Scotty Court checked coumadin last Friday and adjusted coumadin dose and recheck INR today.  INR was 2.4  Dr Scotty Court told pt to take coumadin 2.5mg  tonight then resume current coumadin dose.  She finished Septra.  I agreed with plan and f/u INR appt made with me on 06/14/14

## 2014-06-06 NOTE — Telephone Encounter (Signed)
Patient's son called to request that you call him in reference to her CCR She has been on antibiotic and had her inr checked yesterday at [facility]   It was 2.4

## 2014-06-14 ENCOUNTER — Ambulatory Visit (INDEPENDENT_AMBULATORY_CARE_PROVIDER_SITE_OTHER): Payer: Medicare Other | Admitting: *Deleted

## 2014-06-14 DIAGNOSIS — Z5181 Encounter for therapeutic drug level monitoring: Secondary | ICD-10-CM

## 2014-06-14 DIAGNOSIS — I4891 Unspecified atrial fibrillation: Secondary | ICD-10-CM | POA: Diagnosis not present

## 2014-06-14 DIAGNOSIS — Z7901 Long term (current) use of anticoagulants: Secondary | ICD-10-CM

## 2014-06-14 LAB — POCT INR: INR: 3.2

## 2014-07-05 ENCOUNTER — Ambulatory Visit (INDEPENDENT_AMBULATORY_CARE_PROVIDER_SITE_OTHER): Payer: Medicare Other | Admitting: *Deleted

## 2014-07-05 DIAGNOSIS — I4891 Unspecified atrial fibrillation: Secondary | ICD-10-CM | POA: Diagnosis not present

## 2014-07-05 DIAGNOSIS — Z7901 Long term (current) use of anticoagulants: Secondary | ICD-10-CM | POA: Diagnosis not present

## 2014-07-05 DIAGNOSIS — Z5181 Encounter for therapeutic drug level monitoring: Secondary | ICD-10-CM | POA: Diagnosis not present

## 2014-07-05 LAB — POCT INR: INR: 2.5

## 2014-08-02 ENCOUNTER — Ambulatory Visit (INDEPENDENT_AMBULATORY_CARE_PROVIDER_SITE_OTHER): Payer: Medicare Other | Admitting: *Deleted

## 2014-08-02 DIAGNOSIS — Z5181 Encounter for therapeutic drug level monitoring: Secondary | ICD-10-CM | POA: Diagnosis not present

## 2014-08-02 DIAGNOSIS — I4891 Unspecified atrial fibrillation: Secondary | ICD-10-CM

## 2014-08-02 DIAGNOSIS — Z7901 Long term (current) use of anticoagulants: Secondary | ICD-10-CM

## 2014-08-02 LAB — POCT INR: INR: 1.6

## 2014-08-16 ENCOUNTER — Ambulatory Visit (INDEPENDENT_AMBULATORY_CARE_PROVIDER_SITE_OTHER): Payer: Medicare Other | Admitting: *Deleted

## 2014-08-16 DIAGNOSIS — Z7901 Long term (current) use of anticoagulants: Secondary | ICD-10-CM | POA: Diagnosis not present

## 2014-08-16 DIAGNOSIS — Z5181 Encounter for therapeutic drug level monitoring: Secondary | ICD-10-CM

## 2014-08-16 DIAGNOSIS — I4891 Unspecified atrial fibrillation: Secondary | ICD-10-CM

## 2014-08-16 LAB — POCT INR: INR: 2.1

## 2014-09-13 ENCOUNTER — Ambulatory Visit (INDEPENDENT_AMBULATORY_CARE_PROVIDER_SITE_OTHER): Payer: Medicare Other | Admitting: *Deleted

## 2014-09-13 DIAGNOSIS — Z7901 Long term (current) use of anticoagulants: Secondary | ICD-10-CM | POA: Diagnosis not present

## 2014-09-13 DIAGNOSIS — Z5181 Encounter for therapeutic drug level monitoring: Secondary | ICD-10-CM | POA: Diagnosis not present

## 2014-09-13 DIAGNOSIS — I4891 Unspecified atrial fibrillation: Secondary | ICD-10-CM | POA: Diagnosis not present

## 2014-09-13 LAB — POCT INR: INR: 2.3

## 2014-10-18 ENCOUNTER — Ambulatory Visit (INDEPENDENT_AMBULATORY_CARE_PROVIDER_SITE_OTHER): Payer: Medicare Other | Admitting: *Deleted

## 2014-10-18 DIAGNOSIS — Z5181 Encounter for therapeutic drug level monitoring: Secondary | ICD-10-CM | POA: Diagnosis not present

## 2014-10-18 DIAGNOSIS — I4891 Unspecified atrial fibrillation: Secondary | ICD-10-CM | POA: Diagnosis not present

## 2014-10-18 DIAGNOSIS — Z7901 Long term (current) use of anticoagulants: Secondary | ICD-10-CM

## 2014-10-18 LAB — POCT INR: INR: 2

## 2014-11-08 ENCOUNTER — Ambulatory Visit (INDEPENDENT_AMBULATORY_CARE_PROVIDER_SITE_OTHER): Payer: Medicare Other | Admitting: *Deleted

## 2014-11-08 DIAGNOSIS — Z7901 Long term (current) use of anticoagulants: Secondary | ICD-10-CM

## 2014-11-08 DIAGNOSIS — I4891 Unspecified atrial fibrillation: Secondary | ICD-10-CM | POA: Diagnosis not present

## 2014-11-08 DIAGNOSIS — Z5181 Encounter for therapeutic drug level monitoring: Secondary | ICD-10-CM | POA: Diagnosis not present

## 2014-11-08 LAB — POCT INR: INR: 2.2

## 2014-11-25 ENCOUNTER — Encounter: Payer: Self-pay | Admitting: Internal Medicine

## 2014-11-25 ENCOUNTER — Ambulatory Visit (INDEPENDENT_AMBULATORY_CARE_PROVIDER_SITE_OTHER): Payer: Medicare Other | Admitting: Internal Medicine

## 2014-11-25 VITALS — BP 128/76 | HR 83 | Ht 66.0 in | Wt 284.2 lb

## 2014-11-25 DIAGNOSIS — Z23 Encounter for immunization: Secondary | ICD-10-CM

## 2014-11-25 DIAGNOSIS — G4733 Obstructive sleep apnea (adult) (pediatric): Secondary | ICD-10-CM | POA: Diagnosis not present

## 2014-11-25 DIAGNOSIS — Z7901 Long term (current) use of anticoagulants: Secondary | ICD-10-CM | POA: Diagnosis not present

## 2014-11-25 DIAGNOSIS — C9210 Chronic myeloid leukemia, BCR/ABL-positive, not having achieved remission: Secondary | ICD-10-CM

## 2014-11-25 DIAGNOSIS — I480 Paroxysmal atrial fibrillation: Secondary | ICD-10-CM

## 2014-11-25 DIAGNOSIS — F341 Dysthymic disorder: Secondary | ICD-10-CM

## 2014-11-25 NOTE — Patient Instructions (Signed)
Your physician wants you to follow-up in: 1 year with Dr. Hilty. You will receive a reminder letter in the mail two months in advance. If you don't receive a letter, please call our office to schedule the follow-up appointment.  

## 2014-11-25 NOTE — Progress Notes (Addendum)
OFFICE NOTE  Chief Complaint:  Fatigue, depression, pain  Primary Care Physician: Deloria Lair, MD  HPI:  Christina Bentley is a 69 year old female first diagnosed with atrial fibrillation 3 years ago. During her first episode she became acutely weak and experienced palpitations. She was admitted ot the hospital at [facility] at that time and had a cardioversion after that event. 1 year ago in December she had a similar event and had a 2nd cardioversion. Patient was cared for by Dr. Lutricia Feil through these 2 episodes and he referred her for ablation with Dr. Koleen Nimrod at [facility] which was completed in January of this year.   Patient states since that time she has only had occasional palpitations lasting less than 30 seconds but none of the weakness she had experienced before. She has had no chest pain, nausea, or diaphoresis although occasionally she will get some mild shortness of breath which gets better with deep breaths. She has experienced some worsening fatigue in the last year but states she is being evaluated by oncology for low platelets as low as 50k and has recently had a bone marrow biopsy.   Christina Bentley returns today for followup. She tells me in the past 6 months she was diagnosed with CML. She is under the care of an oncologist at [facility]. At this time there are no plans for any chemotherapy. She's also been struggling with fatigue and recently had a fall at home. It sounds like she slipped on the floor had problems with her balance. She had significant soft tissue injuries and continues to be in pain. She cannot lay in bed and needs to sleep in a lift chair. A lot of her struggles are probably related to her weight which is excessive. Her body mass index is almost 50 kg/m2.  There have been discussions between her and her previous cardiologist about weight loss in the past and she is considered a comprehensive weight management plan including possibly a gastric bypass  surgery, but has been hesitant to do that given her age. Her quality of life, however is reduced significantly by her weight. This is also contributing to her falls, instability, joint pains and overall fatigue and shortness of breath with minimal exertion. Fortunately she has infrequent palpitations and has responded well to ablation.  I saw Christina Bentley back today in the office. Unfortunately she continues to struggle. She is undergoing significant depression. She's not managed to lose weight in fact has gained some weight. She recently was noted to be anemic and was started on iron with some improvement. She does have CML and that seems to be fairly stable. She struggled with falls and weakness in her legs. She is now trying to get a wheelchair or power wheelchair in order to get around. She denies any palpitations or significant fluttering to suggest recurrent atrial fibrillation. Her INRs have been fairly labile but she prefers to stay on warfarin.  PMHx:  Past Medical History  Diagnosis Date  . Hypertension   . Depression   . Dyslipidemia   . Obesity   . Chronic kidney disease, unspecified   . Other and unspecified hyperlipidemia   . Atrial fibrillation     Past Surgical History  Procedure Laterality Date  . Abdominal hysterectomy    . Cholecystectomy, laparoscopic    . Hernia repair    . Knee surgery      Right knee arthroscopy  . Cardioversion  2011  . Cardioversion  12/2011  . Cardiac electrophysiology mapping and ablation  01/2012    FAMHx:  No family history on file.  SOCHx:   reports that she quit smoking about 46 years ago. Her smoking use included Cigarettes. She has a .6 pack-year smoking history. She has never used smokeless tobacco. She reports that she drinks alcohol. Her drug history is not on file.  ALLERGIES:  No Known Allergies  ROS: A comprehensive review of systems was negative except for: Constitutional: positive for fatigue Cardiovascular: positive for  palpitations Hematologic/lymphatic: positive for CML Musculoskeletal: positive for muscle weakness and myalgias  HOME MEDS: Current Outpatient Prescriptions  Medication Sig Dispense Refill  . allopurinol (ZYLOPRIM) 300 MG tablet Take 300 mg by mouth daily.    . clonazePAM (KLONOPIN) 1 MG tablet Take 1 mg by mouth daily. Takes 1/2 tablet morning and lunch and 1 tablet at bedtime    . colchicine 0.6 MG tablet Take 0.6 mg by mouth daily as needed.    . diltiazem (CARDIZEM CD) 240 MG 24 hr capsule Take 1 capsule (240 mg total) by mouth daily. 90 capsule 3  . fluticasone (FLONASE) 50 MCG/ACT nasal spray Place into both nostrils daily.    . furosemide (LASIX) 40 MG tablet Take 40 mg by mouth daily.     . iron polysaccharides (NIFEREX) 150 MG capsule Take 150 mg by mouth daily.    . ondansetron (ZOFRAN) 4 MG tablet Take 4 mg by mouth every 8 (eight) hours as needed for nausea or vomiting.    Marland Kitchen oxyCODONE-acetaminophen (PERCOCET/ROXICET) 5-325 MG per tablet One and 1/2 tabs po qid 180 tablet 0  . spironolactone (ALDACTONE) 25 MG tablet Take 25 mg by mouth daily.    . traMADol (ULTRAM) 50 MG tablet Take 50 mg by mouth every 8 (eight) hours as needed.    . venlafaxine (EFFEXOR) 75 MG tablet Take 75 mg by mouth 2 (two) times daily.    Marland Kitchen warfarin (COUMADIN) 5 MG tablet Take per INR 90 tablet 0   No current facility-administered medications for this visit.    LABS/IMAGING: No results found for this or any previous visit (from the past 48 hour(s)). No results found.  VITALS: BP 128/76 mmHg  Pulse 83  Ht '5\' 6"'  (1.676 m)  Wt 284 lb 3.2 oz (128.912 kg)  BMI 45.89 kg/m2  EXAM: General appearance: alert and cooperative Neck: no carotid bruit and no JVD Lungs: clear to auscultation bilaterally Heart: regular rate and rhythm. 1/6 SEM early peaking.  Extremities: 2+ edema bilaterally, venous stasis changes Skin: Skin color, texture, turgor normal. No rashes or lesions Neurologic: Grossly  normal  EKG: Normal sinus rhythm at 83  ASSESSMENT: 1. Paroxysmal Atrial Fibrillation on Coumadin. S/p 2 cardioversions and ablation in January 2014.  2. Hypertension 3. Encounter for long term use of anticoagulant 4. Hyperlpidemia (last total cholesterol 208, HDL 39, LDL 127) 5. Morbid obesity with BMI near 50 kg/m2 6. Depression and recurrent falls  PLAN: 1.   Christina Bentley unfortunately suffering from depression and poor mobility with recurrent falls. Unfortunate she's not been able to lose weight and its compounding her problems. Her INR has been somewhat labile however she prefers to stay on this. She does not seem to be gaining any fluid weight and is on Lasix daily. Heart rate has been well controlled and she seems to be maintaining sinus rhythm. I would not make any changes to her medications today. Recently her blood pressure was low and her primary took  her off of losartan. Plan to see her back annually or sooner as necessary. Finally, she has requested a flu vaccine today which we will provide.  Pixie Casino, MD, [facility] Attending Cardiologist [facility]   11/25/2014 5:28 PM

## 2014-12-06 ENCOUNTER — Ambulatory Visit (INDEPENDENT_AMBULATORY_CARE_PROVIDER_SITE_OTHER): Payer: Medicare Other | Admitting: *Deleted

## 2014-12-06 DIAGNOSIS — Z7901 Long term (current) use of anticoagulants: Secondary | ICD-10-CM | POA: Diagnosis not present

## 2014-12-06 DIAGNOSIS — I4891 Unspecified atrial fibrillation: Secondary | ICD-10-CM

## 2014-12-06 DIAGNOSIS — Z5181 Encounter for therapeutic drug level monitoring: Secondary | ICD-10-CM | POA: Diagnosis not present

## 2014-12-06 LAB — POCT INR: INR: 2.5

## 2015-01-03 ENCOUNTER — Ambulatory Visit (INDEPENDENT_AMBULATORY_CARE_PROVIDER_SITE_OTHER): Payer: Medicare Other | Admitting: *Deleted

## 2015-01-03 DIAGNOSIS — I4891 Unspecified atrial fibrillation: Secondary | ICD-10-CM | POA: Diagnosis not present

## 2015-01-03 DIAGNOSIS — Z7901 Long term (current) use of anticoagulants: Secondary | ICD-10-CM

## 2015-01-03 DIAGNOSIS — Z5181 Encounter for therapeutic drug level monitoring: Secondary | ICD-10-CM

## 2015-01-03 LAB — POCT INR: INR: 3

## 2015-02-03 DIAGNOSIS — D509 Iron deficiency anemia, unspecified: Secondary | ICD-10-CM | POA: Diagnosis not present

## 2015-02-03 DIAGNOSIS — K5790 Diverticulosis of intestine, part unspecified, without perforation or abscess without bleeding: Secondary | ICD-10-CM | POA: Diagnosis not present

## 2015-02-03 DIAGNOSIS — K5730 Diverticulosis of large intestine without perforation or abscess without bleeding: Secondary | ICD-10-CM | POA: Diagnosis not present

## 2015-02-03 DIAGNOSIS — K259 Gastric ulcer, unspecified as acute or chronic, without hemorrhage or perforation: Secondary | ICD-10-CM | POA: Diagnosis not present

## 2015-02-03 DIAGNOSIS — Z9049 Acquired absence of other specified parts of digestive tract: Secondary | ICD-10-CM | POA: Diagnosis not present

## 2015-02-03 DIAGNOSIS — Z7951 Long term (current) use of inhaled steroids: Secondary | ICD-10-CM | POA: Diagnosis not present

## 2015-02-03 DIAGNOSIS — Z809 Family history of malignant neoplasm, unspecified: Secondary | ICD-10-CM | POA: Diagnosis not present

## 2015-02-03 DIAGNOSIS — I129 Hypertensive chronic kidney disease with stage 1 through stage 4 chronic kidney disease, or unspecified chronic kidney disease: Secondary | ICD-10-CM | POA: Diagnosis not present

## 2015-02-03 DIAGNOSIS — Z79899 Other long term (current) drug therapy: Secondary | ICD-10-CM | POA: Diagnosis not present

## 2015-02-03 DIAGNOSIS — Z6841 Body Mass Index (BMI) 40.0 and over, adult: Secondary | ICD-10-CM | POA: Diagnosis not present

## 2015-02-03 DIAGNOSIS — N189 Chronic kidney disease, unspecified: Secondary | ICD-10-CM | POA: Diagnosis not present

## 2015-02-03 DIAGNOSIS — I10 Essential (primary) hypertension: Secondary | ICD-10-CM | POA: Diagnosis not present

## 2015-02-03 DIAGNOSIS — E669 Obesity, unspecified: Secondary | ICD-10-CM | POA: Diagnosis not present

## 2015-02-03 DIAGNOSIS — Z7901 Long term (current) use of anticoagulants: Secondary | ICD-10-CM | POA: Diagnosis not present

## 2015-02-03 DIAGNOSIS — D123 Benign neoplasm of transverse colon: Secondary | ICD-10-CM | POA: Diagnosis not present

## 2015-02-14 DIAGNOSIS — Z7901 Long term (current) use of anticoagulants: Secondary | ICD-10-CM | POA: Diagnosis not present

## 2015-02-14 DIAGNOSIS — C9310 Chronic myelomonocytic leukemia not having achieved remission: Secondary | ICD-10-CM | POA: Diagnosis not present

## 2015-02-14 DIAGNOSIS — E611 Iron deficiency: Secondary | ICD-10-CM | POA: Diagnosis not present

## 2015-02-14 DIAGNOSIS — F329 Major depressive disorder, single episode, unspecified: Secondary | ICD-10-CM | POA: Diagnosis not present

## 2015-02-14 DIAGNOSIS — I10 Essential (primary) hypertension: Secondary | ICD-10-CM | POA: Diagnosis not present

## 2015-02-14 DIAGNOSIS — Z87891 Personal history of nicotine dependence: Secondary | ICD-10-CM | POA: Diagnosis not present

## 2015-02-21 ENCOUNTER — Ambulatory Visit (INDEPENDENT_AMBULATORY_CARE_PROVIDER_SITE_OTHER): Payer: Medicare Other | Admitting: *Deleted

## 2015-02-21 DIAGNOSIS — Z5181 Encounter for therapeutic drug level monitoring: Secondary | ICD-10-CM | POA: Diagnosis not present

## 2015-02-21 DIAGNOSIS — I4891 Unspecified atrial fibrillation: Secondary | ICD-10-CM | POA: Diagnosis not present

## 2015-02-21 LAB — POCT INR: INR: 1.9

## 2015-02-27 DIAGNOSIS — F329 Major depressive disorder, single episode, unspecified: Secondary | ICD-10-CM | POA: Diagnosis not present

## 2015-02-27 DIAGNOSIS — E611 Iron deficiency: Secondary | ICD-10-CM | POA: Diagnosis not present

## 2015-02-27 DIAGNOSIS — C9310 Chronic myelomonocytic leukemia not having achieved remission: Secondary | ICD-10-CM | POA: Diagnosis not present

## 2015-02-27 DIAGNOSIS — F418 Other specified anxiety disorders: Secondary | ICD-10-CM | POA: Diagnosis not present

## 2015-02-27 DIAGNOSIS — G479 Sleep disorder, unspecified: Secondary | ICD-10-CM | POA: Diagnosis not present

## 2015-03-08 DIAGNOSIS — N9089 Other specified noninflammatory disorders of vulva and perineum: Secondary | ICD-10-CM | POA: Diagnosis not present

## 2015-03-14 ENCOUNTER — Ambulatory Visit (INDEPENDENT_AMBULATORY_CARE_PROVIDER_SITE_OTHER): Payer: Medicare Other | Admitting: *Deleted

## 2015-03-14 DIAGNOSIS — Z5181 Encounter for therapeutic drug level monitoring: Secondary | ICD-10-CM | POA: Diagnosis not present

## 2015-03-14 DIAGNOSIS — I4891 Unspecified atrial fibrillation: Secondary | ICD-10-CM

## 2015-03-14 LAB — POCT INR: INR: 2.4

## 2015-03-15 IMAGING — PT NM PET TUM IMG INITIAL (PI) SKULL BASE T - THIGH
6 series · 25 of 25 positions shown · non-contrast
Comparison: No priors.

CLINICAL DATA: Initial treatment strategy for thrombocytopenia.

EXAM:
NUCLEAR MEDICINE PET SKULL BASE TO THIGH
FASTING BLOOD GLUCOSE:  Value: 88mg/dl
TECHNIQUE: 18.5 mCi F-18 FDG was injected intravenously. CT data was obtained
and used for attenuation correction and anatomic localization only.
(This was not acquired as a diagnostic CT examination.) Additional
exam technical data entered on technologist worksheet.

[Series 1: pet ac · axial · 3.3mm · 4.69mm/px · z∈[-928,-58]mm · 5 of 267 slices shown]
[im 1/267]
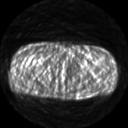
[im 67/267]
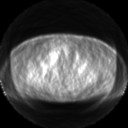
[im 134/267]
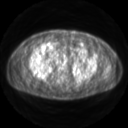
[im 200/267]
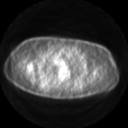
[im 267/267]
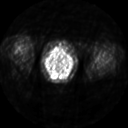

[Series 2: ct images · axial · 3.8mm · 0.98mm/px · z∈[-928,-58]mm · 5 of 264 slices shown]
[im 1/264]
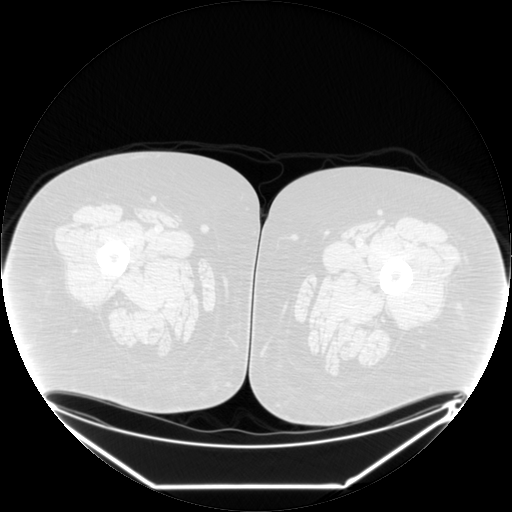
[im 66/264]
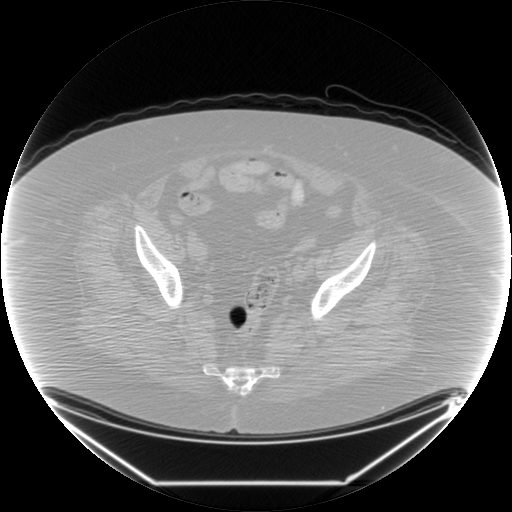
[im 132/264]
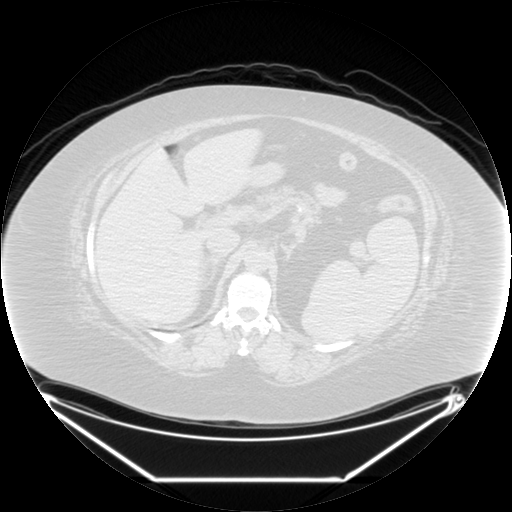
[im 198/264]
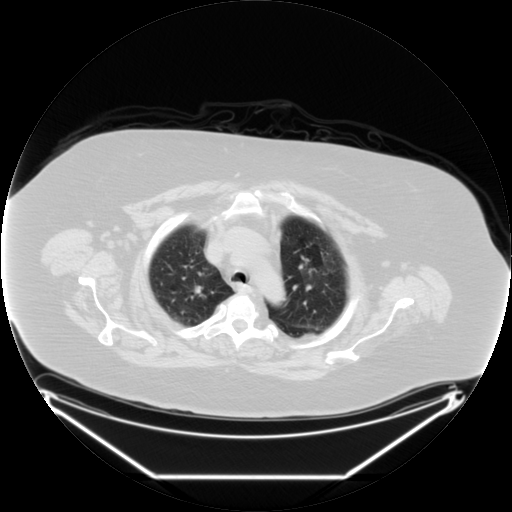
[im 264/264  brain]
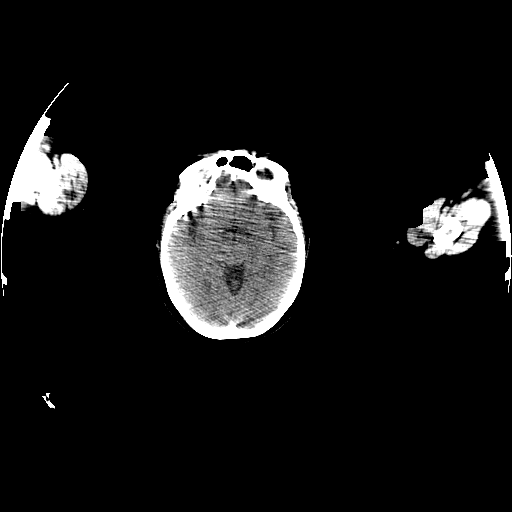

[Series 2: pet nac · axial · 3.3mm · 4.69mm/px · z∈[-928,-58]mm · 6 of 267 slices shown]
[im 1/267]
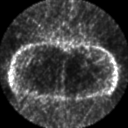
[im 54/267]
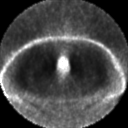
[im 107/267]
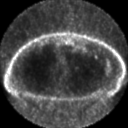
[im 160/267]
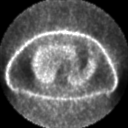
[im 213/267]
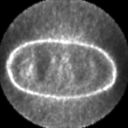
[im 267/267]
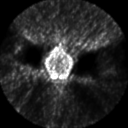

[Series 123: mip · coronal · 3.3mm · 4.69mm/px · 1 of 30 slices shown]
[im 1/30]
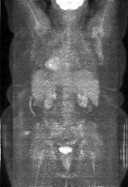

[Series 151: reformatted · axial · 3.3mm · 3.91mm/px · z∈[-928,-58]mm · 6 of 267 slices shown (1 of 2)]
[im 1/267]
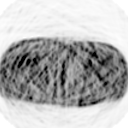
[im 54/267]
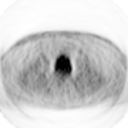
[im 107/267]
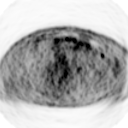
[im 160/267]
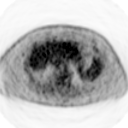
[im 213/267]
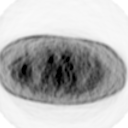
[im 267/267]
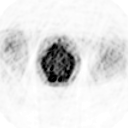

[Series 153: reformatted · coronal · 4.7mm · 6.98mm/px · 2 of 76 slices shown (2 of 2)]
[im 1/76]
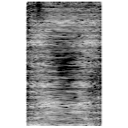
[im 76/76]
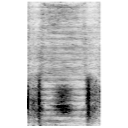

[25 of 25 positions shown; findings below may reference images not displayed]

FINDINGS: NECK

No hypermetabolic lymph nodes in the neck.

CHEST

No hypermetabolic mediastinal or hilar nodes. No suspicious
pulmonary nodules on the CT scan. Small calcified granuloma in the
right middle lobe. 6 mm in noncalcified nodule in the periphery of
the right lower lobe (image 98 of series 2), and 8 mm non calcified
nodule in the periphery of the left lower lobe (image 95 of series
2); neither of which demonstrate hypermetabolism on the PET portion
of the examination.

ABDOMEN/PELVIS

The spleen is enlarged measuring 15.3 x 9.5 x 17.0 cm (estimated
splenic volume of 1,235 mL), with relatively uniform activity
throughout the spleen (SUVmax = 6.5), which is lower than the
metabolic activity of the liver. No abnormal hypermetabolic activity
within the liver, pancreas or adrenal glands. No hypermetabolic
lymph nodes in the abdomen or pelvis. Postoperative changes of mesh
repair for ventral hernia are noted. Status post cholecystectomy.

SKELETON

No focal hypermetabolic activity to suggest skeletal metastasis.
IMPRESSION: 1. Study is positive for splenomegaly, with estimated splenic volume
of 1,235 mm. No definite splenic hypermetabolism noted on the PET
portion of the examination.
2. No suspicious areas of hypermetabolism on today's examination.
3. Small pulmonary nodules in the lungs bilaterally, as above,
largest of which measures 8 mm in the periphery of the left lower
lobe. These are not hypermetabolic on the PET portion of the
examination, however, they are likely below the discriminatory size
of PET imaging. Accordingly, a followup evaluation with chest CT is
recommended. If the patient is at high risk for bronchogenic
carcinoma, follow-up chest CT at 3-9months is recommended. If the
patient is at low risk for bronchogenic carcinoma, follow-up chest
CT at 6-12 months is recommended. This recommendation follows the
consensus statement: Guidelines for Management of Small Pulmonary
Nodules Detected on CT Scans: A Statement from the [facility]
4. Additional incidental findings, as above.

## 2015-03-21 ENCOUNTER — Telehealth: Payer: Self-pay | Admitting: *Deleted

## 2015-03-21 NOTE — Telephone Encounter (Signed)
Pt is scheduled for surgery 3/15 to have mole removed from buttock.  She will hold coumadin 5 days before procedure.  She will take last dose of coumadin 04/06/15 and resume night of 3/15 if ok with Surgeon.  She will take coumadin 5mg  x 4 days then resume 2.5mg  daily except 5mg  on T,Th,Sat. And come for INR check on 04/20/15.

## 2015-03-29 DIAGNOSIS — F418 Other specified anxiety disorders: Secondary | ICD-10-CM | POA: Diagnosis not present

## 2015-03-29 DIAGNOSIS — C9310 Chronic myelomonocytic leukemia not having achieved remission: Secondary | ICD-10-CM | POA: Diagnosis not present

## 2015-03-29 DIAGNOSIS — G479 Sleep disorder, unspecified: Secondary | ICD-10-CM | POA: Diagnosis not present

## 2015-03-29 DIAGNOSIS — F329 Major depressive disorder, single episode, unspecified: Secondary | ICD-10-CM | POA: Diagnosis not present

## 2015-04-04 DIAGNOSIS — Z85828 Personal history of other malignant neoplasm of skin: Secondary | ICD-10-CM | POA: Diagnosis not present

## 2015-04-04 DIAGNOSIS — L570 Actinic keratosis: Secondary | ICD-10-CM | POA: Diagnosis not present

## 2015-04-10 DIAGNOSIS — Z87891 Personal history of nicotine dependence: Secondary | ICD-10-CM | POA: Diagnosis not present

## 2015-04-10 DIAGNOSIS — R32 Unspecified urinary incontinence: Secondary | ICD-10-CM | POA: Diagnosis not present

## 2015-04-10 DIAGNOSIS — M109 Gout, unspecified: Secondary | ICD-10-CM | POA: Diagnosis not present

## 2015-04-10 DIAGNOSIS — I4891 Unspecified atrial fibrillation: Secondary | ICD-10-CM | POA: Diagnosis not present

## 2015-04-10 DIAGNOSIS — G629 Polyneuropathy, unspecified: Secondary | ICD-10-CM | POA: Diagnosis not present

## 2015-04-10 DIAGNOSIS — Z85828 Personal history of other malignant neoplasm of skin: Secondary | ICD-10-CM | POA: Diagnosis not present

## 2015-04-10 DIAGNOSIS — Z9049 Acquired absence of other specified parts of digestive tract: Secondary | ICD-10-CM | POA: Diagnosis not present

## 2015-04-10 DIAGNOSIS — L821 Other seborrheic keratosis: Secondary | ICD-10-CM | POA: Diagnosis not present

## 2015-04-10 DIAGNOSIS — Z856 Personal history of leukemia: Secondary | ICD-10-CM | POA: Diagnosis not present

## 2015-04-10 DIAGNOSIS — Z7951 Long term (current) use of inhaled steroids: Secondary | ICD-10-CM | POA: Diagnosis not present

## 2015-04-10 DIAGNOSIS — F329 Major depressive disorder, single episode, unspecified: Secondary | ICD-10-CM | POA: Diagnosis not present

## 2015-04-10 DIAGNOSIS — I10 Essential (primary) hypertension: Secondary | ICD-10-CM | POA: Diagnosis not present

## 2015-04-10 DIAGNOSIS — K588 Other irritable bowel syndrome: Secondary | ICD-10-CM | POA: Diagnosis not present

## 2015-04-10 DIAGNOSIS — Z6841 Body Mass Index (BMI) 40.0 and over, adult: Secondary | ICD-10-CM | POA: Diagnosis not present

## 2015-04-10 DIAGNOSIS — E785 Hyperlipidemia, unspecified: Secondary | ICD-10-CM | POA: Diagnosis not present

## 2015-04-10 DIAGNOSIS — Z7901 Long term (current) use of anticoagulants: Secondary | ICD-10-CM | POA: Diagnosis not present

## 2015-04-10 DIAGNOSIS — F419 Anxiety disorder, unspecified: Secondary | ICD-10-CM | POA: Diagnosis not present

## 2015-04-10 DIAGNOSIS — K219 Gastro-esophageal reflux disease without esophagitis: Secondary | ICD-10-CM | POA: Diagnosis not present

## 2015-04-10 DIAGNOSIS — E669 Obesity, unspecified: Secondary | ICD-10-CM | POA: Diagnosis not present

## 2015-04-10 DIAGNOSIS — N9089 Other specified noninflammatory disorders of vulva and perineum: Secondary | ICD-10-CM | POA: Diagnosis not present

## 2015-04-10 DIAGNOSIS — Z79899 Other long term (current) drug therapy: Secondary | ICD-10-CM | POA: Diagnosis not present

## 2015-04-11 DIAGNOSIS — R791 Abnormal coagulation profile: Secondary | ICD-10-CM | POA: Diagnosis not present

## 2015-04-12 DIAGNOSIS — Z79899 Other long term (current) drug therapy: Secondary | ICD-10-CM | POA: Diagnosis not present

## 2015-04-12 DIAGNOSIS — I4891 Unspecified atrial fibrillation: Secondary | ICD-10-CM | POA: Diagnosis not present

## 2015-04-12 DIAGNOSIS — Z856 Personal history of leukemia: Secondary | ICD-10-CM | POA: Diagnosis not present

## 2015-04-12 DIAGNOSIS — Z7901 Long term (current) use of anticoagulants: Secondary | ICD-10-CM | POA: Diagnosis not present

## 2015-04-12 DIAGNOSIS — F419 Anxiety disorder, unspecified: Secondary | ICD-10-CM | POA: Diagnosis not present

## 2015-04-12 DIAGNOSIS — L821 Other seborrheic keratosis: Secondary | ICD-10-CM | POA: Diagnosis not present

## 2015-04-12 DIAGNOSIS — I10 Essential (primary) hypertension: Secondary | ICD-10-CM | POA: Diagnosis not present

## 2015-04-12 DIAGNOSIS — Z7951 Long term (current) use of inhaled steroids: Secondary | ICD-10-CM | POA: Diagnosis not present

## 2015-04-12 DIAGNOSIS — G629 Polyneuropathy, unspecified: Secondary | ICD-10-CM | POA: Diagnosis not present

## 2015-04-12 DIAGNOSIS — E669 Obesity, unspecified: Secondary | ICD-10-CM | POA: Diagnosis not present

## 2015-04-12 DIAGNOSIS — F329 Major depressive disorder, single episode, unspecified: Secondary | ICD-10-CM | POA: Diagnosis not present

## 2015-04-12 DIAGNOSIS — M109 Gout, unspecified: Secondary | ICD-10-CM | POA: Diagnosis not present

## 2015-04-12 DIAGNOSIS — N9089 Other specified noninflammatory disorders of vulva and perineum: Secondary | ICD-10-CM | POA: Diagnosis not present

## 2015-04-12 DIAGNOSIS — D287 Benign neoplasm of other specified female genital organs: Secondary | ICD-10-CM | POA: Diagnosis not present

## 2015-04-16 DIAGNOSIS — S8001XA Contusion of right knee, initial encounter: Secondary | ICD-10-CM | POA: Diagnosis not present

## 2015-04-16 DIAGNOSIS — S0990XA Unspecified injury of head, initial encounter: Secondary | ICD-10-CM | POA: Diagnosis not present

## 2015-04-16 DIAGNOSIS — M25561 Pain in right knee: Secondary | ICD-10-CM | POA: Diagnosis not present

## 2015-04-16 DIAGNOSIS — S161XXA Strain of muscle, fascia and tendon at neck level, initial encounter: Secondary | ICD-10-CM | POA: Diagnosis not present

## 2015-04-16 DIAGNOSIS — S8991XA Unspecified injury of right lower leg, initial encounter: Secondary | ICD-10-CM | POA: Diagnosis not present

## 2015-04-16 DIAGNOSIS — Z7901 Long term (current) use of anticoagulants: Secondary | ICD-10-CM | POA: Diagnosis not present

## 2015-04-16 DIAGNOSIS — W1839XA Other fall on same level, initial encounter: Secondary | ICD-10-CM | POA: Diagnosis not present

## 2015-04-16 DIAGNOSIS — G4489 Other headache syndrome: Secondary | ICD-10-CM | POA: Diagnosis not present

## 2015-04-16 DIAGNOSIS — Z79899 Other long term (current) drug therapy: Secondary | ICD-10-CM | POA: Diagnosis not present

## 2015-04-16 DIAGNOSIS — K219 Gastro-esophageal reflux disease without esophagitis: Secondary | ICD-10-CM | POA: Diagnosis not present

## 2015-04-16 DIAGNOSIS — S0993XA Unspecified injury of face, initial encounter: Secondary | ICD-10-CM | POA: Diagnosis not present

## 2015-04-16 DIAGNOSIS — I10 Essential (primary) hypertension: Secondary | ICD-10-CM | POA: Diagnosis not present

## 2015-04-16 DIAGNOSIS — M109 Gout, unspecified: Secondary | ICD-10-CM | POA: Diagnosis not present

## 2015-04-16 DIAGNOSIS — F329 Major depressive disorder, single episode, unspecified: Secondary | ICD-10-CM | POA: Diagnosis not present

## 2015-04-16 DIAGNOSIS — T148 Other injury of unspecified body region: Secondary | ICD-10-CM | POA: Diagnosis not present

## 2015-04-16 DIAGNOSIS — S199XXA Unspecified injury of neck, initial encounter: Secondary | ICD-10-CM | POA: Diagnosis not present

## 2015-04-16 DIAGNOSIS — M1711 Unilateral primary osteoarthritis, right knee: Secondary | ICD-10-CM | POA: Diagnosis not present

## 2015-04-16 DIAGNOSIS — R609 Edema, unspecified: Secondary | ICD-10-CM | POA: Diagnosis not present

## 2015-04-16 DIAGNOSIS — I4891 Unspecified atrial fibrillation: Secondary | ICD-10-CM | POA: Diagnosis not present

## 2015-04-18 DIAGNOSIS — I10 Essential (primary) hypertension: Secondary | ICD-10-CM | POA: Diagnosis not present

## 2015-04-18 DIAGNOSIS — T148 Other injury of unspecified body region: Secondary | ICD-10-CM | POA: Diagnosis not present

## 2015-04-18 DIAGNOSIS — W19XXXA Unspecified fall, initial encounter: Secondary | ICD-10-CM | POA: Diagnosis not present

## 2015-04-25 ENCOUNTER — Ambulatory Visit (INDEPENDENT_AMBULATORY_CARE_PROVIDER_SITE_OTHER): Payer: Medicare Other | Admitting: *Deleted

## 2015-04-25 DIAGNOSIS — Z5181 Encounter for therapeutic drug level monitoring: Secondary | ICD-10-CM

## 2015-04-25 DIAGNOSIS — I4891 Unspecified atrial fibrillation: Secondary | ICD-10-CM

## 2015-04-25 LAB — POCT INR: INR: 2.2

## 2015-05-16 DIAGNOSIS — S0093XA Contusion of unspecified part of head, initial encounter: Secondary | ICD-10-CM | POA: Diagnosis not present

## 2015-05-16 DIAGNOSIS — I10 Essential (primary) hypertension: Secondary | ICD-10-CM | POA: Diagnosis not present

## 2015-05-16 DIAGNOSIS — Z7901 Long term (current) use of anticoagulants: Secondary | ICD-10-CM | POA: Diagnosis not present

## 2015-05-16 DIAGNOSIS — M109 Gout, unspecified: Secondary | ICD-10-CM | POA: Diagnosis not present

## 2015-05-16 DIAGNOSIS — R220 Localized swelling, mass and lump, head: Secondary | ICD-10-CM | POA: Diagnosis not present

## 2015-05-16 DIAGNOSIS — I4891 Unspecified atrial fibrillation: Secondary | ICD-10-CM | POA: Diagnosis not present

## 2015-05-16 DIAGNOSIS — W01198A Fall on same level from slipping, tripping and stumbling with subsequent striking against other object, initial encounter: Secondary | ICD-10-CM | POA: Diagnosis not present

## 2015-05-16 DIAGNOSIS — K219 Gastro-esophageal reflux disease without esophagitis: Secondary | ICD-10-CM | POA: Diagnosis not present

## 2015-05-16 DIAGNOSIS — R51 Headache: Secondary | ICD-10-CM | POA: Diagnosis not present

## 2015-05-16 DIAGNOSIS — F329 Major depressive disorder, single episode, unspecified: Secondary | ICD-10-CM | POA: Diagnosis not present

## 2015-05-16 DIAGNOSIS — Z856 Personal history of leukemia: Secondary | ICD-10-CM | POA: Diagnosis not present

## 2015-05-16 DIAGNOSIS — S0083XA Contusion of other part of head, initial encounter: Secondary | ICD-10-CM | POA: Diagnosis not present

## 2015-05-16 DIAGNOSIS — Z79899 Other long term (current) drug therapy: Secondary | ICD-10-CM | POA: Diagnosis not present

## 2015-05-16 DIAGNOSIS — S199XXA Unspecified injury of neck, initial encounter: Secondary | ICD-10-CM | POA: Diagnosis not present

## 2015-05-19 DIAGNOSIS — T148 Other injury of unspecified body region: Secondary | ICD-10-CM | POA: Diagnosis not present

## 2015-05-19 DIAGNOSIS — Z9181 History of falling: Secondary | ICD-10-CM | POA: Diagnosis not present

## 2015-05-19 DIAGNOSIS — I480 Paroxysmal atrial fibrillation: Secondary | ICD-10-CM | POA: Diagnosis not present

## 2015-05-19 DIAGNOSIS — S0083XA Contusion of other part of head, initial encounter: Secondary | ICD-10-CM | POA: Diagnosis not present

## 2015-05-19 DIAGNOSIS — M6281 Muscle weakness (generalized): Secondary | ICD-10-CM | POA: Diagnosis not present

## 2015-05-23 ENCOUNTER — Ambulatory Visit (INDEPENDENT_AMBULATORY_CARE_PROVIDER_SITE_OTHER): Payer: Medicare Other | Admitting: *Deleted

## 2015-05-23 DIAGNOSIS — Z5181 Encounter for therapeutic drug level monitoring: Secondary | ICD-10-CM | POA: Diagnosis not present

## 2015-05-23 DIAGNOSIS — I4891 Unspecified atrial fibrillation: Secondary | ICD-10-CM

## 2015-05-23 LAB — POCT INR: INR: 3

## 2015-05-24 DIAGNOSIS — D696 Thrombocytopenia, unspecified: Secondary | ICD-10-CM | POA: Diagnosis not present

## 2015-05-24 DIAGNOSIS — C9310 Chronic myelomonocytic leukemia not having achieved remission: Secondary | ICD-10-CM | POA: Diagnosis not present

## 2015-05-29 DIAGNOSIS — S8001XD Contusion of right knee, subsequent encounter: Secondary | ICD-10-CM | POA: Diagnosis not present

## 2015-05-29 DIAGNOSIS — K5901 Slow transit constipation: Secondary | ICD-10-CM | POA: Diagnosis not present

## 2015-06-16 DIAGNOSIS — H1031 Unspecified acute conjunctivitis, right eye: Secondary | ICD-10-CM | POA: Diagnosis not present

## 2015-06-19 DIAGNOSIS — H578 Other specified disorders of eye and adnexa: Secondary | ICD-10-CM | POA: Diagnosis not present

## 2015-06-20 DIAGNOSIS — A048 Other specified bacterial intestinal infections: Secondary | ICD-10-CM | POA: Diagnosis not present

## 2015-06-20 DIAGNOSIS — D123 Benign neoplasm of transverse colon: Secondary | ICD-10-CM | POA: Diagnosis not present

## 2015-06-22 ENCOUNTER — Ambulatory Visit (INDEPENDENT_AMBULATORY_CARE_PROVIDER_SITE_OTHER): Payer: Medicare Other | Admitting: *Deleted

## 2015-06-22 DIAGNOSIS — Z5181 Encounter for therapeutic drug level monitoring: Secondary | ICD-10-CM | POA: Diagnosis not present

## 2015-06-22 DIAGNOSIS — I4891 Unspecified atrial fibrillation: Secondary | ICD-10-CM

## 2015-06-22 LAB — POCT INR: INR: 2.9

## 2015-06-27 DIAGNOSIS — K5900 Constipation, unspecified: Secondary | ICD-10-CM | POA: Diagnosis not present

## 2015-06-27 DIAGNOSIS — T887XXA Unspecified adverse effect of drug or medicament, initial encounter: Secondary | ICD-10-CM | POA: Diagnosis not present

## 2015-06-27 DIAGNOSIS — R112 Nausea with vomiting, unspecified: Secondary | ICD-10-CM | POA: Diagnosis not present

## 2015-07-04 ENCOUNTER — Ambulatory Visit (INDEPENDENT_AMBULATORY_CARE_PROVIDER_SITE_OTHER): Payer: Medicare Other | Admitting: *Deleted

## 2015-07-04 DIAGNOSIS — I4891 Unspecified atrial fibrillation: Secondary | ICD-10-CM

## 2015-07-04 DIAGNOSIS — Z5181 Encounter for therapeutic drug level monitoring: Secondary | ICD-10-CM | POA: Diagnosis not present

## 2015-07-04 LAB — POCT INR: INR: 2

## 2015-07-07 DIAGNOSIS — A048 Other specified bacterial intestinal infections: Secondary | ICD-10-CM | POA: Diagnosis not present

## 2015-07-07 DIAGNOSIS — C9310 Chronic myelomonocytic leukemia not having achieved remission: Secondary | ICD-10-CM | POA: Diagnosis not present

## 2015-07-19 DIAGNOSIS — C9310 Chronic myelomonocytic leukemia not having achieved remission: Secondary | ICD-10-CM | POA: Diagnosis not present

## 2015-07-25 ENCOUNTER — Ambulatory Visit (INDEPENDENT_AMBULATORY_CARE_PROVIDER_SITE_OTHER): Payer: Medicare Other | Admitting: *Deleted

## 2015-07-25 DIAGNOSIS — Z5181 Encounter for therapeutic drug level monitoring: Secondary | ICD-10-CM | POA: Diagnosis not present

## 2015-07-25 DIAGNOSIS — I4891 Unspecified atrial fibrillation: Secondary | ICD-10-CM

## 2015-07-25 LAB — POCT INR: INR: 3.7

## 2015-08-15 ENCOUNTER — Ambulatory Visit (INDEPENDENT_AMBULATORY_CARE_PROVIDER_SITE_OTHER): Payer: Medicare Other | Admitting: *Deleted

## 2015-08-15 DIAGNOSIS — Z5181 Encounter for therapeutic drug level monitoring: Secondary | ICD-10-CM | POA: Diagnosis not present

## 2015-08-15 DIAGNOSIS — I4891 Unspecified atrial fibrillation: Secondary | ICD-10-CM | POA: Diagnosis not present

## 2015-08-15 LAB — POCT INR: INR: 2.4

## 2015-08-29 DIAGNOSIS — H00021 Hordeolum internum right upper eyelid: Secondary | ICD-10-CM | POA: Diagnosis not present

## 2015-09-14 ENCOUNTER — Ambulatory Visit (INDEPENDENT_AMBULATORY_CARE_PROVIDER_SITE_OTHER): Payer: Medicare Other | Admitting: *Deleted

## 2015-09-14 DIAGNOSIS — I4891 Unspecified atrial fibrillation: Secondary | ICD-10-CM | POA: Diagnosis not present

## 2015-09-14 DIAGNOSIS — Z5181 Encounter for therapeutic drug level monitoring: Secondary | ICD-10-CM | POA: Diagnosis not present

## 2015-09-14 LAB — POCT INR: INR: 2.2

## 2015-10-24 ENCOUNTER — Ambulatory Visit (INDEPENDENT_AMBULATORY_CARE_PROVIDER_SITE_OTHER): Payer: Medicare Other | Admitting: *Deleted

## 2015-10-24 DIAGNOSIS — Z5181 Encounter for therapeutic drug level monitoring: Secondary | ICD-10-CM

## 2015-10-24 DIAGNOSIS — I4891 Unspecified atrial fibrillation: Secondary | ICD-10-CM | POA: Diagnosis not present

## 2015-10-24 LAB — POCT INR: INR: 4

## 2015-11-07 DIAGNOSIS — C9310 Chronic myelomonocytic leukemia not having achieved remission: Secondary | ICD-10-CM | POA: Diagnosis not present

## 2015-11-07 DIAGNOSIS — I480 Paroxysmal atrial fibrillation: Secondary | ICD-10-CM | POA: Diagnosis not present

## 2015-11-07 DIAGNOSIS — Z23 Encounter for immunization: Secondary | ICD-10-CM | POA: Diagnosis not present

## 2015-11-07 DIAGNOSIS — I10 Essential (primary) hypertension: Secondary | ICD-10-CM | POA: Diagnosis not present

## 2015-11-07 DIAGNOSIS — M170 Bilateral primary osteoarthritis of knee: Secondary | ICD-10-CM | POA: Diagnosis not present

## 2015-11-07 DIAGNOSIS — E790 Hyperuricemia without signs of inflammatory arthritis and tophaceous disease: Secondary | ICD-10-CM | POA: Diagnosis not present

## 2015-11-08 DIAGNOSIS — C9310 Chronic myelomonocytic leukemia not having achieved remission: Secondary | ICD-10-CM | POA: Diagnosis not present

## 2015-11-08 DIAGNOSIS — E790 Hyperuricemia without signs of inflammatory arthritis and tophaceous disease: Secondary | ICD-10-CM | POA: Diagnosis not present

## 2015-11-14 ENCOUNTER — Ambulatory Visit (INDEPENDENT_AMBULATORY_CARE_PROVIDER_SITE_OTHER): Payer: Medicare Other | Admitting: *Deleted

## 2015-11-14 DIAGNOSIS — I4891 Unspecified atrial fibrillation: Secondary | ICD-10-CM | POA: Diagnosis not present

## 2015-11-14 DIAGNOSIS — Z5181 Encounter for therapeutic drug level monitoring: Secondary | ICD-10-CM

## 2015-11-14 LAB — POCT INR: INR: 2.8

## 2015-11-15 ENCOUNTER — Ambulatory Visit (INDEPENDENT_AMBULATORY_CARE_PROVIDER_SITE_OTHER): Payer: Medicare Other | Admitting: Internal Medicine

## 2015-11-15 ENCOUNTER — Encounter: Payer: Self-pay | Admitting: Internal Medicine

## 2015-11-15 VITALS — BP 136/85 | HR 78 | Ht 67.0 in | Wt 280.2 lb

## 2015-11-15 DIAGNOSIS — I481 Persistent atrial fibrillation: Secondary | ICD-10-CM | POA: Diagnosis not present

## 2015-11-15 DIAGNOSIS — R0602 Shortness of breath: Secondary | ICD-10-CM | POA: Diagnosis not present

## 2015-11-15 DIAGNOSIS — I4819 Other persistent atrial fibrillation: Secondary | ICD-10-CM

## 2015-11-15 DIAGNOSIS — R5382 Chronic fatigue, unspecified: Secondary | ICD-10-CM | POA: Diagnosis not present

## 2015-11-15 DIAGNOSIS — C9210 Chronic myeloid leukemia, BCR/ABL-positive, not having achieved remission: Secondary | ICD-10-CM

## 2015-11-15 NOTE — Patient Instructions (Signed)
Medication Instructions:  Your physician recommends that you continue on your current medications as directed. Please refer to the Current Medication list given to you today.  Labwork: None   Testing/Procedures: Your physician has requested that you have an echocardiogram. Echocardiography is a painless test that uses sound waves to create images of your heart. It provides your doctor with information about the size and shape of your heart and how well your heart's chambers and valves are working. This procedure takes approximately one hour. There are no restrictions for this procedure.  Follow-Up: Your physician wants you to follow-up in: 12 months with Dr Debara Pickett. You will receive a reminder letter in the mail two months in advance. If you don't receive a letter, please call our office to schedule the follow-up appointment.  Any Other Special Instructions Will Be Listed Below (If Applicable).     If you need a refill on your cardiac medications before your next appointment, please call your pharmacy.

## 2015-11-15 NOTE — Progress Notes (Signed)
OFFICE NOTE  Chief Complaint:  Fatigue, depression, pain  Primary Care Physician: Deloria Lair, MD  HPI:  Christina Bentley is a 70 year old female first diagnosed with atrial fibrillation 3 years ago. During her first episode she became acutely weak and experienced palpitations. She was admitted ot the hospital at [facility] at that time and had a cardioversion after that event. 1 year ago in December she had a similar event and had a 2nd cardioversion. Patient was cared for by Dr. Lutricia Feil through these 2 episodes and he referred her for ablation with Dr. Koleen Nimrod at [facility] which was completed in January of this year.   Patient states since that time she has only had occasional palpitations lasting less than 30 seconds but none of the weakness she had experienced before. She has had no chest pain, nausea, or diaphoresis although occasionally she will get some mild shortness of breath which gets better with deep breaths. She has experienced some worsening fatigue in the last year but states she is being evaluated by oncology for low platelets as low as 50k and has recently had a bone marrow biopsy.   Christina Bentley returns today for followup. She tells me in the past 6 months she was diagnosed with CML. She is under the care of an oncologist at [place]. At this time there are no plans for any chemotherapy. She's also been struggling with fatigue and recently had a fall at home. It sounds like she slipped on the floor had problems with her balance. She had significant soft tissue injuries and continues to be in pain. She cannot lay in bed and needs to sleep in a lift chair. A lot of her struggles are probably related to her weight which is excessive. Her body mass index is almost 50 kg/m2.  There have been discussions between her and her previous cardiologist about weight loss in the past and she is considered a comprehensive weight management plan including possibly a gastric bypass  surgery, but has been hesitant to do that given her age. Her quality of life, however is reduced significantly by her weight. This is also contributing to her falls, instability, joint pains and overall fatigue and shortness of breath with minimal exertion. Fortunately she has infrequent palpitations and has responded well to ablation.  I saw Christina Bentley back today in the office. Unfortunately she continues to struggle. She is undergoing significant depression. She's not managed to lose weight in fact has gained some weight. She recently was noted to be anemic and was started on iron with some improvement. She does have CML and that seems to be fairly stable. She struggled with falls and weakness in her legs. She is now trying to get a wheelchair or power wheelchair in order to get around. She denies any palpitations or significant fluttering to suggest recurrent atrial fibrillation. Her INRs have been fairly labile but she prefers to stay on warfarin.  11/15/2015  Christina Bentley returns today for follow-up with her husband. She continues to have significant fatigue and recently has had some worsening shortness of breath. Her last echo was in 2013. INRs recently are better but she continues to prefer to be on warfarin. She's not been bothered by significant anemia related to her CML. Weight appears to be down about 10 pounds.  PMHx:  Past Medical History  Diagnosis Date  . Hypertension   . Depression   . Dyslipidemia   . Obesity   . Chronic  kidney disease, unspecified   . Other and unspecified hyperlipidemia   . Atrial fibrillation     Past Surgical History:  Procedure Laterality Date  . ABDOMINAL HYSTERECTOMY    . CARDIAC ELECTROPHYSIOLOGY MAPPING AND ABLATION  01/2012  . CARDIOVERSION  2011  . CARDIOVERSION  12/2011  . CHOLECYSTECTOMY, LAPAROSCOPIC    . HERNIA REPAIR    . KNEE SURGERY     Right knee arthroscopy    FAMHx:  No family history on file.  SOCHx:   reports that she quit  smoking about 47 years ago. Her smoking use included Cigarettes. She has a 0.60 pack-year smoking history. She has never used smokeless tobacco. She reports that she drinks alcohol. Her drug history is not on file.  ALLERGIES:  No Known Allergies  ROS: Pertinent items noted in HPI and remainder of comprehensive ROS otherwise negative.  HOME MEDS: Current Outpatient Prescriptions  Medication Sig Dispense Refill  . acyclovir (ZOVIRAX) 800 MG tablet Take 800 mg by mouth three time daily as needed for fever blisters for 3 days.    Marland Kitchen allopurinol (ZYLOPRIM) 300 MG tablet Take 300 mg by mouth daily.    . Biotin w/ Vitamins C & E (HAIR/SKIN/NAILS PO) Take 1 tablet by mouth at bedtime.    . colchicine 0.6 MG tablet Take 0.6 mg by mouth daily as needed. (gout)    . diltiazem (CARDIZEM CD) 240 MG 24 hr capsule Take 1 capsule (240 mg total) by mouth daily. 90 capsule 3  . fluticasone (FLONASE) 50 MCG/ACT nasal spray Place 1 spray into both nostrils daily as needed. (sinus relief)    . furosemide (LASIX) 40 MG tablet Take 40 mg by mouth daily.     Marland Kitchen lactulose (CHRONULAC) 10 GM/15ML solution Take 10 g by mouth at bedtime.    . Melatonin 3 MG TABS Take 6 mg by mouth at bedtime.    . Multiple Vitamin (MULTIVITAMIN WITH MINERALS) TABS tablet Take 1 tablet by mouth 4 (four) times a week.    . polyethylene glycol (MIRALAX / GLYCOLAX) packet Take 17 g by mouth at bedtime.    . Probiotic Product (PROBIOTIC FORMULA) CAPS Take 1 capsule by mouth 3 (three) times a week. Mondays, Wednesdays, and Fridays    . ranitidine (ZANTAC) 150 MG capsule Take 150 mg by mouth 2 (two) times daily.    . traMADol (ULTRAM) 50 MG tablet Take 50 mg by mouth every 8 (eight) hours as needed for pain.    Marland Kitchen triamcinolone ointment (KENALOG) 0.1 % Apply 1 application topically at bedtime.    Marland Kitchen venlafaxine (EFFEXOR) 75 MG tablet Take 75 mg by mouth 2 (two) times daily.    Marland Kitchen warfarin (COUMADIN) 5 MG tablet Take per INR 90 tablet 0   No  current facility-administered medications for this visit.     LABS/IMAGING: Results for orders placed or performed in visit on 11/14/15 (from the past 48 hour(s))  POCT INR     Status: Normal   Collection Time: 11/14/15  3:46 PM  Result Value Ref Range   INR 2.8    No results found.  VITALS: BP 136/85   Pulse 78   Ht _0  (1.702 m)   Wt 280 lb 3.2 oz (127.1 kg)   BMI 43.89 kg/m   EXAM: General appearance: alert, cooperative and morbidly obese Neck: no carotid bruit and no JVD Lungs: clear to auscultation bilaterally Heart: irregularly irregular rhythm and 1/6 SEM early peaking.  Extremities: 1+ edema bilaterally,  venous stasis changes Skin: Skin color, texture, turgor normal. No rashes or lesions Neurologic: Grossly normal  EKG: A. fib at 78  ASSESSMENT: 1. Progressive DOE/fatigue 2. Persistent Atrial Fibrillation (CHADSVASC score of 3)on Coumadin. S/p 2 cardioversions and ablation in January 2014.  3. Hypertension 4. Encounter for long term use of anticoagulant 5. Hyperlpidemia (last total cholesterol 208, HDL 39, LDL 127) 6. Morbid obesity with BMI near 50 kg/m2 7. Depression and recurrent falls  PLAN: 1.   Christina Bentley has had some progressive dyspnea on exertion and fatigue. Her last echo was in 2013 and I would like to reassess that today. Blood pressure appears well controlled. She is on warfarin for her A. Fib. Plan follow-up annually or sooner given any abnormalities on her echo.  Pixie Casino, MD, [facility] Attending Cardiologist [facility]   11/15/2015 6:19 PM

## 2015-11-16 ENCOUNTER — Telehealth: Payer: Self-pay | Admitting: *Deleted

## 2015-11-16 NOTE — Telephone Encounter (Signed)
Patient's husband calling to verify what prescription Dr. Debara Pickett had changed at Ms. O'Kean visit yesterday. Would like a call back to make sure if something was changed or sent in to their pharmacy. Will forward message to Dr. Debara Pickett and [facility].

## 2015-11-16 NOTE — Telephone Encounter (Signed)
Spoke to wife and clarified recommendations (OK per husband's DPR). She voiced understanding and thanks.

## 2015-11-16 NOTE — Telephone Encounter (Signed)
I don't think we discussed any medicine changes - only the repeat echo. I did add a medication for her husband.   Dr. Lemmie Evens

## 2015-11-16 NOTE — Telephone Encounter (Signed)
   Patient informs me she thought Dr. Debara Pickett had discussed a medication change or new Rx yesterday at appt. I reviewed notes and this does not seem to be the case. There was discusion about her INRs and possible switching off of warfarin to another medication, but patient states she told Dr. Debara Pickett she wanted to remain on the warfarin. I will let Dr. Debara Pickett review to see if anything was overlooked from OV notes.  Spoke to patient directly as I do not have updated DPR.  She is aware she may complete new DPR when she comes back for echo.

## 2015-12-12 ENCOUNTER — Other Ambulatory Visit: Payer: Self-pay

## 2015-12-12 ENCOUNTER — Ambulatory Visit (HOSPITAL_COMMUNITY): Payer: Medicare Other | Attending: Cardiovascular Disease

## 2015-12-12 DIAGNOSIS — R5382 Chronic fatigue, unspecified: Secondary | ICD-10-CM

## 2015-12-12 DIAGNOSIS — R0602 Shortness of breath: Secondary | ICD-10-CM | POA: Diagnosis not present

## 2015-12-12 DIAGNOSIS — I5189 Other ill-defined heart diseases: Secondary | ICD-10-CM | POA: Diagnosis not present

## 2015-12-12 DIAGNOSIS — I517 Cardiomegaly: Secondary | ICD-10-CM | POA: Diagnosis not present

## 2015-12-19 ENCOUNTER — Ambulatory Visit (INDEPENDENT_AMBULATORY_CARE_PROVIDER_SITE_OTHER): Payer: Medicare Other | Admitting: *Deleted

## 2015-12-19 DIAGNOSIS — Z5181 Encounter for therapeutic drug level monitoring: Secondary | ICD-10-CM

## 2015-12-19 DIAGNOSIS — I4891 Unspecified atrial fibrillation: Secondary | ICD-10-CM | POA: Diagnosis not present

## 2015-12-19 LAB — POCT INR: INR: 2.1

## 2015-12-25 DIAGNOSIS — R0602 Shortness of breath: Secondary | ICD-10-CM | POA: Diagnosis not present

## 2015-12-25 DIAGNOSIS — R05 Cough: Secondary | ICD-10-CM | POA: Diagnosis not present

## 2016-01-25 ENCOUNTER — Ambulatory Visit (INDEPENDENT_AMBULATORY_CARE_PROVIDER_SITE_OTHER): Payer: Medicare Other | Admitting: *Deleted

## 2016-01-25 DIAGNOSIS — I4891 Unspecified atrial fibrillation: Secondary | ICD-10-CM

## 2016-01-25 DIAGNOSIS — Z5181 Encounter for therapeutic drug level monitoring: Secondary | ICD-10-CM

## 2016-01-25 LAB — POCT INR: INR: 2.5

## 2016-02-19 DIAGNOSIS — E790 Hyperuricemia without signs of inflammatory arthritis and tophaceous disease: Secondary | ICD-10-CM | POA: Diagnosis not present

## 2016-02-19 DIAGNOSIS — R05 Cough: Secondary | ICD-10-CM | POA: Diagnosis not present

## 2016-02-23 DIAGNOSIS — Z23 Encounter for immunization: Secondary | ICD-10-CM | POA: Diagnosis not present

## 2016-03-07 ENCOUNTER — Ambulatory Visit (INDEPENDENT_AMBULATORY_CARE_PROVIDER_SITE_OTHER): Payer: Medicare Other | Admitting: *Deleted

## 2016-03-07 DIAGNOSIS — I4891 Unspecified atrial fibrillation: Secondary | ICD-10-CM | POA: Diagnosis not present

## 2016-03-07 DIAGNOSIS — Z5181 Encounter for therapeutic drug level monitoring: Secondary | ICD-10-CM | POA: Diagnosis not present

## 2016-03-07 LAB — POCT INR: INR: 4.3

## 2016-03-15 DIAGNOSIS — J328 Other chronic sinusitis: Secondary | ICD-10-CM | POA: Diagnosis not present

## 2016-03-15 DIAGNOSIS — J342 Deviated nasal septum: Secondary | ICD-10-CM | POA: Diagnosis not present

## 2016-03-15 DIAGNOSIS — R05 Cough: Secondary | ICD-10-CM | POA: Diagnosis not present

## 2016-03-20 DIAGNOSIS — J411 Mucopurulent chronic bronchitis: Secondary | ICD-10-CM | POA: Diagnosis not present

## 2016-03-21 ENCOUNTER — Ambulatory Visit (INDEPENDENT_AMBULATORY_CARE_PROVIDER_SITE_OTHER): Payer: Medicare Other | Admitting: *Deleted

## 2016-03-21 DIAGNOSIS — I4891 Unspecified atrial fibrillation: Secondary | ICD-10-CM

## 2016-03-21 DIAGNOSIS — Z5181 Encounter for therapeutic drug level monitoring: Secondary | ICD-10-CM | POA: Diagnosis not present

## 2016-03-21 LAB — POCT INR: INR: 2.8

## 2016-04-02 ENCOUNTER — Ambulatory Visit (INDEPENDENT_AMBULATORY_CARE_PROVIDER_SITE_OTHER): Payer: Medicare Other | Admitting: *Deleted

## 2016-04-02 DIAGNOSIS — Z5181 Encounter for therapeutic drug level monitoring: Secondary | ICD-10-CM

## 2016-04-02 DIAGNOSIS — I4891 Unspecified atrial fibrillation: Secondary | ICD-10-CM | POA: Diagnosis not present

## 2016-04-02 LAB — POCT INR: INR: 1.7

## 2016-04-18 ENCOUNTER — Ambulatory Visit (INDEPENDENT_AMBULATORY_CARE_PROVIDER_SITE_OTHER): Payer: Medicare Other | Admitting: *Deleted

## 2016-04-18 DIAGNOSIS — I4891 Unspecified atrial fibrillation: Secondary | ICD-10-CM

## 2016-04-18 DIAGNOSIS — Z5181 Encounter for therapeutic drug level monitoring: Secondary | ICD-10-CM | POA: Diagnosis not present

## 2016-04-18 LAB — POCT INR: INR: 1.8

## 2016-04-25 ENCOUNTER — Ambulatory Visit (INDEPENDENT_AMBULATORY_CARE_PROVIDER_SITE_OTHER): Payer: Medicare Other | Admitting: Internal Medicine

## 2016-04-25 ENCOUNTER — Encounter: Payer: Self-pay | Admitting: Internal Medicine

## 2016-04-25 VITALS — BP 126/82 | HR 90 | Ht 67.0 in | Wt 274.0 lb

## 2016-04-25 DIAGNOSIS — J45991 Cough variant asthma: Secondary | ICD-10-CM | POA: Diagnosis not present

## 2016-04-25 MED ORDER — PREDNISONE 10 MG PO TABS
ORAL_TABLET | ORAL | 0 refills | Status: DC
Start: 2016-04-25 — End: 2016-05-21

## 2016-04-25 MED ORDER — PANTOPRAZOLE SODIUM 40 MG PO TBEC: 40.0000 mg | DELAYED_RELEASE_TABLET | Freq: Every day | ORAL | 2 refills | Status: DC

## 2016-04-25 MED ORDER — AMOXICILLIN-POT CLAVULANATE 875-125 MG PO TABS: 1.0000 | ORAL_TABLET | Freq: Two times a day (BID) | ORAL | 0 refills | Status: DC

## 2016-04-25 MED ORDER — TRAMADOL HCL 50 MG PO TABS: 50.0000 mg | ORAL_TABLET | Freq: Four times a day (QID) | ORAL | 0 refills | Status: AC | PRN

## 2016-04-25 MED ORDER — BUDESONIDE-FORMOTEROL FUMARATE 80-4.5 MCG/ACT IN AERO: 2.0000 | INHALATION_SPRAY | Freq: Two times a day (BID) | RESPIRATORY_TRACT | 0 refills | Status: DC

## 2016-04-25 MED ORDER — BUDESONIDE-FORMOTEROL FUMARATE 80-4.5 MCG/ACT IN AERO: 2.0000 | INHALATION_SPRAY | Freq: Two times a day (BID) | RESPIRATORY_TRACT | 11 refills | Status: AC

## 2016-04-25 NOTE — Progress Notes (Signed)
Subjective:     Patient ID: Christina Bentley, female   DOB: [birth year], 71 y.o.   MRN: [id number]  HPI  3 yowf quit smoking 1970 with no resp problem at all, very active although no aerobics then fell March  2015 with severe pain in buttocks and never the same since with breathing labored just across the room assoc with wheezing then cough acute at [facility] 2017 persisted ever since so referred to pulmonary clinic 04/25/2016 by Dr   Matthias Hughs    04/25/2016 1st [facility] office visit/ Wert  Zantac 150 mg twice  Chief Complaint  Patient presents with  . Pulmonary Consult    Referred by Dr. Matthias Hughs. Pt c/o cough that started right before Christmas 2017. Cough is prod with "yellow infection".  Cough is worse when she first gets up in the am.  She states she coughs alot when she lies down and sometimes has to sleep propped up.  Sometimes cough wakes her up in the night.   initially dry "whooping" with gagging but no vomiting with coughing 24/7 and productive Jan 2018  Cough wakes her up sometimes and that's when mucus most productive  Prednisone may have helped the most temporarily / symb 160 helped the breathing but not so much the cough  Doe = MMRC2 = can't walk a nl pace on a flat grade s sob but does fine slow and flat eg shopping   No obvious day to day or daytime variability or assoc excess/ purulent sputum or mucus plugs or hemoptysis or cp or chest tightness, subjective wheeze or overt sinus or hb symptoms. No unusual exp hx or h/o childhood pna/ asthma or knowledge of premature birth.  Sleeping ok without nocturnal  or early am exacerbation  of respiratory  c/o's or need for noct saba. Also denies any obvious fluctuation of symptoms with weather or environmental changes or other aggravating or alleviating factors except as outlined above   Current Medications, Allergies, Complete Past Medical History, Past Surgical History, Family History, and Social History were reviewed in  [employer] record.  ROS  The following are not active complaints unless bolded sore throat, dysphagia, dental problems, itching, sneezing,  nasal congestion or excess/ purulent secretions, ear ache,   fever, chills, sweats, unintended wt loss, classically pleuritic or exertional cp,  orthopnea pnd or leg swelling, presyncope, palpitations, abdominal pain, anorexia, nausea, vomiting, diarrhea  or change in bowel or bladder habits, change in stools or urine, dysuria,hematuria,  rash, arthralgias, visual complaints, headache, numbness, weakness or ataxia or problems with walking or coordination,  change in mood/affect or memory.            Review of Systems     Objective:   Physical Exam amb obese wf unusual affect, almost giddy at times  Wt Readings from Last 3 Encounters:  04/25/16 274 lb (124.3 kg)  11/15/15 280 lb 3.2 oz (127.1 kg)  11/25/14 284 lb 3.2 oz (128.9 kg)    Vital signs reviewed - Note on arrival 02 sats  94% on RA     HEENT: nl dentition, turbinates bilaterally, and oropharynx. Nl external ear canals without cough reflex   NECK :  without JVD/Nodes/TM/ nl carotid upstrokes bilaterally   LUNGS: no acc muscle use,  Nl contour chest which is clear to A and P bilaterally without cough on insp or exp maneuvers   CV:  RRR  no s3 or murmur or increase in P2, and no edema  ABD:  soft and nontender with nl inspiratory excursion in the supine position. No bruits or organomegaly appreciated, bowel sounds nl  MS:  Nl gait/ ext warm without deformities, calf tenderness, cyanosis or clubbing No obvious joint restrictions   SKIN: warm and dry without lesions    NEURO:  alert, approp, nl sensorium with  no motor or cerebellar deficits apparent.     I personally reviewed images and agree with radiology impression as follows:  CXR:   03/15/16  wnl  Sinus CT 03/15/16  wnl     Assessment:

## 2016-04-25 NOTE — Assessment & Plan Note (Signed)
Spirometry 04/25/2016  Restrictive changes only on symb 160 2bid with persistent cough  - 04/25/2016  After extensive coaching HFA effectiveness =    75% so try symb 80 2bid and cyclical cough rx / augmentin x 10 day and pred x 6   The most common causes of chronic cough in immunocompetent adults include the following: upper airway cough syndrome (UACS), previously referred to as postnasal drip syndrome (PNDS), which is caused by variety of rhinosinus conditions; (2) asthma; (3) GERD; (4) chronic bronchitis from cigarette smoking or other inhaled environmental irritants; (5) nonasthmatic eosinophilic bronchitis; and (6) bronchiectasis.   These conditions, singly or in combination, have accounted for up to 94% of the causes of chronic cough in prospective studies.   Other conditions have constituted no >6% of the causes in prospective studies These have included bronchogenic carcinoma, chronic interstitial pneumonia, sarcoidosis, left ventricular failure, ACEI-induced cough, and aspiration from a condition associated with pharyngeal dysfunction.    Chronic cough is often simultaneously caused by more than one condition. A single cause has been found from 38 to 82% of the time, multiple causes from 18 to 62%. Multiply caused cough has been the result of three diseases up to 42% of the time.       Most likely this is either cough variant asthma or Upper airway cough syndrome (previously labeled PNDS) , is  so named because it's frequently impossible to sort out how much is  CR/sinusitis with freq throat clearing (which can be related to primary GERD)   vs  causing  secondary (" extra esophageal")  GERD from wide swings in gastric pressure that occur with throat clearing, often  promoting self use of mint and menthol lozenges that reduce the lower esophageal sphincter tone and exacerbate the problem further in a cyclical fashion.   These are the same pts (now being labeled as having "irritable larynx  syndrome" by some cough centers) who not infrequently have a history of having failed to tolerate ace inhibitors,  dry powder inhalers or biphosphonates or report having atypical/extraesophageal reflux symptoms that don't respond to standard doses of PPI  and are easily confused as having aecopd or asthma flares by even experienced allergists/ pulmonologists (myself included).   Of the three most common causes of  Sub-acute or recurrent or chronic cough, only one (GERD)  can actually contribute to/ trigger  the other two (asthma and post nasal drip syndrome)  and perpetuate the cylce of cough.  While not intuitively obvious, many patients with chronic low grade reflux do not cough until there is a primary insult that disturbs the protective epithelial barrier and exposes sensitive nerve endings.   This is typically viral but can be direct physical injury such as with an endotracheal tube.   The point is that once this occurs, it is difficult to eliminate the cycle  using anything but a maximally effective acid suppression regimen at least in the short run, accompanied by an appropriate diet to address non acid GERD and control / eliminate the cough itself for at least 3 days.    Try max rx for gerd and eliminate cyclical cough with tramadol then regroup in 4 weeks  Total time devoted to counseling  > 50 % of initial 60 min office visit:  review case with pt/husband/ son discussion of options/alternatives/ personally creating written customized instructions  in presence of pt  then going over those specific  Instructions directly with the pt including how to use all of  the meds but in particular covering each new medication in detail and the difference between the maintenance= "automatic" meds and the prns using an action plan format for the latter (If this problem/symptom => do that organization reading Left to right).  Please see AVS from this visit for a full list of these instructions which I personally  wrote for this pt and  are unique to this visit.

## 2016-04-25 NOTE — Assessment & Plan Note (Signed)
Body mass index is 42.91 kg/m.   No results found for: TSH   Contributing to gerd risk/ doe/reviewed the need and the process to achieve and maintain neg calorie balance > defer f/u primary care including intermittently monitoring thyroid status

## 2016-04-25 NOTE — Patient Instructions (Addendum)
Pantoprazole (protonix) 40 mg   Take  30-60 min before first meal of the day and zantac 150 mg x 2  One hour before  bedtime until return to office - this is the best way to tell whether stomach acid is contributing to your problem.    Prednisone 10 mg take  4 each am x 2 days,   2 each am x 2 days,  1 each am x 2 days and stop   symbicort reduce to 80/4.5 and Take 2 puffs first thing in am and then another 2 puffs about 12 hours later.      Augmentin 875 mg take one pill twice daily  X 10 days - take at breakfast and supper with large glass of water.  It would help reduce the usual side effects (diarrhea and yeast infections) if you ate cultured yogurt at lunch.   GERD (REFLUX)  is an extremely common cause of respiratory symptoms just like yours , many times with no obvious heartburn at all.    It can be treated with medication, but also with lifestyle changes including elevation of the head of your bed (ideally with 6 inch  bed blocks),  Smoking cessation, avoidance of late meals, excessive alcohol, and avoid fatty foods, chocolate, peppermint, colas, red wine, and acidic juices such as orange juice.  NO MINT OR MENTHOL PRODUCTS SO NO COUGH DROPS   USE SUGARLESS CANDY INSTEAD (Jolley ranchers or Stover's or Life Savers) or even ice chips will also do - the key is to swallow to prevent all throat clearing. NO OIL BASED VITAMINS - use powdered substitutes.    Take delsym two tsp every 12 hours and supplement if needed with  tramadol 50 mg up to 2 every 4 hours to suppress the urge to cough. Swallowing water or using ice chips/non mint and menthol containing candies (such as lifesavers or sugarless jolly ranchers) are also effective.  You should rest your voice and avoid activities that you know make you cough.  Once you have eliminated the cough for 3 straight days try reducing the tramadol first,  then the delsym as tolerated.     If not better return with all active medications in hand in 4  weeks

## 2016-04-29 DIAGNOSIS — D469 Myelodysplastic syndrome, unspecified: Secondary | ICD-10-CM | POA: Diagnosis not present

## 2016-04-29 DIAGNOSIS — G252 Other specified forms of tremor: Secondary | ICD-10-CM | POA: Diagnosis not present

## 2016-04-29 DIAGNOSIS — D696 Thrombocytopenia, unspecified: Secondary | ICD-10-CM | POA: Diagnosis not present

## 2016-04-29 DIAGNOSIS — R161 Splenomegaly, not elsewhere classified: Secondary | ICD-10-CM | POA: Diagnosis not present

## 2016-04-29 DIAGNOSIS — K219 Gastro-esophageal reflux disease without esophagitis: Secondary | ICD-10-CM | POA: Diagnosis not present

## 2016-04-29 DIAGNOSIS — C9310 Chronic myelomonocytic leukemia not having achieved remission: Secondary | ICD-10-CM | POA: Diagnosis not present

## 2016-05-02 ENCOUNTER — Ambulatory Visit (INDEPENDENT_AMBULATORY_CARE_PROVIDER_SITE_OTHER): Payer: Medicare Other | Admitting: *Deleted

## 2016-05-02 DIAGNOSIS — I4891 Unspecified atrial fibrillation: Secondary | ICD-10-CM

## 2016-05-02 DIAGNOSIS — Z5181 Encounter for therapeutic drug level monitoring: Secondary | ICD-10-CM | POA: Diagnosis not present

## 2016-05-02 LAB — POCT INR: INR: 1.7

## 2016-05-06 DIAGNOSIS — R161 Splenomegaly, not elsewhere classified: Secondary | ICD-10-CM | POA: Diagnosis not present

## 2016-05-06 DIAGNOSIS — Z9049 Acquired absence of other specified parts of digestive tract: Secondary | ICD-10-CM | POA: Diagnosis not present

## 2016-05-06 DIAGNOSIS — N281 Cyst of kidney, acquired: Secondary | ICD-10-CM | POA: Diagnosis not present

## 2016-05-10 ENCOUNTER — Institutional Professional Consult (permissible substitution): Payer: Medicare Other | Admitting: Internal Medicine

## 2016-05-21 ENCOUNTER — Ambulatory Visit (INDEPENDENT_AMBULATORY_CARE_PROVIDER_SITE_OTHER): Payer: Medicare Other | Admitting: *Deleted

## 2016-05-21 DIAGNOSIS — I4891 Unspecified atrial fibrillation: Secondary | ICD-10-CM | POA: Diagnosis not present

## 2016-05-21 DIAGNOSIS — Z5181 Encounter for therapeutic drug level monitoring: Secondary | ICD-10-CM

## 2016-05-21 LAB — POCT INR: INR: 2.7

## 2016-05-28 ENCOUNTER — Ambulatory Visit: Payer: Medicare Other | Admitting: Internal Medicine

## 2016-05-30 DIAGNOSIS — J449 Chronic obstructive pulmonary disease, unspecified: Secondary | ICD-10-CM | POA: Diagnosis not present

## 2016-05-30 DIAGNOSIS — C9310 Chronic myelomonocytic leukemia not having achieved remission: Secondary | ICD-10-CM | POA: Diagnosis not present

## 2016-05-30 DIAGNOSIS — I480 Paroxysmal atrial fibrillation: Secondary | ICD-10-CM | POA: Diagnosis not present

## 2016-06-18 ENCOUNTER — Ambulatory Visit (INDEPENDENT_AMBULATORY_CARE_PROVIDER_SITE_OTHER): Payer: Medicare Other | Admitting: *Deleted

## 2016-06-18 DIAGNOSIS — Z5181 Encounter for therapeutic drug level monitoring: Secondary | ICD-10-CM

## 2016-06-18 DIAGNOSIS — I4891 Unspecified atrial fibrillation: Secondary | ICD-10-CM

## 2016-06-18 LAB — POCT INR: INR: 2.7

## 2016-06-25 DIAGNOSIS — R161 Splenomegaly, not elsewhere classified: Secondary | ICD-10-CM | POA: Diagnosis not present

## 2016-06-25 DIAGNOSIS — C9310 Chronic myelomonocytic leukemia not having achieved remission: Secondary | ICD-10-CM | POA: Diagnosis not present

## 2016-06-25 DIAGNOSIS — F419 Anxiety disorder, unspecified: Secondary | ICD-10-CM | POA: Diagnosis not present

## 2016-06-25 DIAGNOSIS — G479 Sleep disorder, unspecified: Secondary | ICD-10-CM | POA: Diagnosis not present

## 2016-06-25 DIAGNOSIS — R1904 Left lower quadrant abdominal swelling, mass and lump: Secondary | ICD-10-CM | POA: Diagnosis not present

## 2016-06-25 DIAGNOSIS — K219 Gastro-esophageal reflux disease without esophagitis: Secondary | ICD-10-CM | POA: Diagnosis not present

## 2016-06-26 DIAGNOSIS — R1904 Left lower quadrant abdominal swelling, mass and lump: Secondary | ICD-10-CM | POA: Diagnosis not present

## 2016-06-26 DIAGNOSIS — G4700 Insomnia, unspecified: Secondary | ICD-10-CM | POA: Diagnosis not present

## 2016-08-08 ENCOUNTER — Ambulatory Visit (INDEPENDENT_AMBULATORY_CARE_PROVIDER_SITE_OTHER): Payer: Medicare Other | Admitting: *Deleted

## 2016-08-08 DIAGNOSIS — Z5181 Encounter for therapeutic drug level monitoring: Secondary | ICD-10-CM | POA: Diagnosis not present

## 2016-08-08 DIAGNOSIS — I4891 Unspecified atrial fibrillation: Secondary | ICD-10-CM

## 2016-08-08 LAB — POCT INR: INR: 2.1

## 2016-08-17 ENCOUNTER — Other Ambulatory Visit: Payer: Self-pay | Admitting: Internal Medicine

## 2016-08-27 DIAGNOSIS — K219 Gastro-esophageal reflux disease without esophagitis: Secondary | ICD-10-CM | POA: Diagnosis not present

## 2016-08-27 DIAGNOSIS — R222 Localized swelling, mass and lump, trunk: Secondary | ICD-10-CM | POA: Diagnosis not present

## 2016-08-27 DIAGNOSIS — R1904 Left lower quadrant abdominal swelling, mass and lump: Secondary | ICD-10-CM | POA: Diagnosis not present

## 2016-08-27 DIAGNOSIS — D469 Myelodysplastic syndrome, unspecified: Secondary | ICD-10-CM | POA: Diagnosis not present

## 2016-08-27 DIAGNOSIS — G479 Sleep disorder, unspecified: Secondary | ICD-10-CM | POA: Diagnosis not present

## 2016-09-26 ENCOUNTER — Ambulatory Visit (INDEPENDENT_AMBULATORY_CARE_PROVIDER_SITE_OTHER): Payer: Medicare Other | Admitting: *Deleted

## 2016-09-26 DIAGNOSIS — Z5181 Encounter for therapeutic drug level monitoring: Secondary | ICD-10-CM

## 2016-09-26 DIAGNOSIS — I4891 Unspecified atrial fibrillation: Secondary | ICD-10-CM | POA: Diagnosis not present

## 2016-09-26 LAB — POCT INR: INR: 3.1

## 2016-10-17 ENCOUNTER — Ambulatory Visit (INDEPENDENT_AMBULATORY_CARE_PROVIDER_SITE_OTHER): Payer: Medicare Other | Admitting: *Deleted

## 2016-10-17 DIAGNOSIS — I4891 Unspecified atrial fibrillation: Secondary | ICD-10-CM | POA: Diagnosis not present

## 2016-10-17 DIAGNOSIS — Z5181 Encounter for therapeutic drug level monitoring: Secondary | ICD-10-CM

## 2016-10-17 LAB — POCT INR: INR: 3.3

## 2016-11-04 DIAGNOSIS — F418 Other specified anxiety disorders: Secondary | ICD-10-CM | POA: Diagnosis not present

## 2016-11-04 DIAGNOSIS — D469 Myelodysplastic syndrome, unspecified: Secondary | ICD-10-CM | POA: Diagnosis not present

## 2016-11-04 DIAGNOSIS — C9310 Chronic myelomonocytic leukemia not having achieved remission: Secondary | ICD-10-CM | POA: Diagnosis not present

## 2016-11-04 DIAGNOSIS — G479 Sleep disorder, unspecified: Secondary | ICD-10-CM | POA: Diagnosis not present

## 2016-11-12 ENCOUNTER — Ambulatory Visit (INDEPENDENT_AMBULATORY_CARE_PROVIDER_SITE_OTHER): Payer: Medicare Other | Admitting: *Deleted

## 2016-11-12 DIAGNOSIS — I4891 Unspecified atrial fibrillation: Secondary | ICD-10-CM | POA: Diagnosis not present

## 2016-11-12 DIAGNOSIS — Z5181 Encounter for therapeutic drug level monitoring: Secondary | ICD-10-CM | POA: Diagnosis not present

## 2016-11-12 LAB — POCT INR: INR: 2.8

## 2016-12-02 DIAGNOSIS — N3946 Mixed incontinence: Secondary | ICD-10-CM | POA: Diagnosis not present

## 2016-12-03 ENCOUNTER — Ambulatory Visit (INDEPENDENT_AMBULATORY_CARE_PROVIDER_SITE_OTHER): Payer: Medicare Other | Admitting: Internal Medicine

## 2016-12-03 ENCOUNTER — Encounter: Payer: Self-pay | Admitting: Internal Medicine

## 2016-12-03 VITALS — BP 118/72 | HR 77 | Ht 67.0 in | Wt 262.0 lb

## 2016-12-03 DIAGNOSIS — I4891 Unspecified atrial fibrillation: Secondary | ICD-10-CM

## 2016-12-03 DIAGNOSIS — G4733 Obstructive sleep apnea (adult) (pediatric): Secondary | ICD-10-CM

## 2016-12-03 DIAGNOSIS — C9210 Chronic myeloid leukemia, BCR/ABL-positive, not having achieved remission: Secondary | ICD-10-CM

## 2016-12-03 DIAGNOSIS — R5383 Other fatigue: Secondary | ICD-10-CM | POA: Diagnosis not present

## 2016-12-03 NOTE — Progress Notes (Signed)
OFFICE NOTE  Chief Complaint:  Fatigue, daytime somnolence  Primary Care Physician: Deloria Lair., MD  HPI:  Christina Bentley is a 71 year old female first diagnosed with atrial fibrillation 3 years ago. During her first episode she became acutely weak and experienced palpitations. She was admitted ot the hospital at [facility] at that time and had a cardioversion after that event. 1 year ago in December she had a similar event and had a 2nd cardioversion. Patient was cared for by Dr. Lutricia Feil through these 2 episodes and he referred her for ablation with Dr. Koleen Nimrod at [facility] which was completed in January of this year.   Patient states since that time she has only had occasional palpitations lasting less than 30 seconds but none of the weakness she had experienced before. She has had no chest pain, nausea, or diaphoresis although occasionally she will get some mild shortness of breath which gets better with deep breaths. She has experienced some worsening fatigue in the last year but states she is being evaluated by oncology for low platelets as low as 50k and has recently had a bone marrow biopsy.   Christina Bentley returns today for followup. She tells me in the past 6 months she was diagnosed with CML. She is under the care of an oncologist at [place]. At this time there are no plans for any chemotherapy. She's also been struggling with fatigue and recently had a fall at home. It sounds like she slipped on the floor had problems with her balance. She had significant soft tissue injuries and continues to be in pain. She cannot lay in bed and needs to sleep in a lift chair. A lot of her struggles are probably related to her weight which is excessive. Her body mass index is almost 50 kg/m2.  There have been discussions between her and her previous cardiologist about weight loss in the past and she is considered a comprehensive weight management plan including possibly a gastric bypass  surgery, but has been hesitant to do that given her age. Her quality of life, however is reduced significantly by her weight. This is also contributing to her falls, instability, joint pains and overall fatigue and shortness of breath with minimal exertion. Fortunately she has infrequent palpitations and has responded well to ablation.  I saw Christina Bentley back today in the office. Unfortunately she continues to struggle. She is undergoing significant depression. She's not managed to lose weight in fact has gained some weight. She recently was noted to be anemic and was started on iron with some improvement. She does have CML and that seems to be fairly stable. She struggled with falls and weakness in her legs. She is now trying to get a wheelchair or power wheelchair in order to get around. She denies any palpitations or significant fluttering to suggest recurrent atrial fibrillation. Her INRs have been fairly labile but she prefers to stay on warfarin.  11/15/2015  Christina Bentley returns today for follow-up with her husband. She continues to have significant fatigue and recently has had some worsening shortness of breath. Her last echo was in 2013. INRs recently are better but she continues to prefer to be on warfarin. She's not been bothered by significant anemia related to her CML. Weight appears to be down about 10 pounds.  12/03/2016  Christina Bentley was seen today in follow-up with her husband.  Daytime fatigue continues to be a problem.  Recently she started taking a  sleep aid at night, but she still is very tired during the day.  Her oncologist suggest that she might have underlying sleep apnea.  I think it is reasonable to assess for this however she said due to her weight and difficulty getting around, she would be unlikely to get an in-hospital sleep study.  We talked about alternatives.  INR is continue to be therapeutic.  She has had anemia in the past.  Weight is down several more pounds since her  last visit which is encouraging.  She denies any chest pain or worsening shortness of breath.  She reports being on topical steroids since her 10s, or over 40 years.  I did mention there is a new steroid free medication for the treatment of atopic dermatitis that is available and she may wish to consider discussing that with her dermatologist.  PMHx:  Past Medical History  Diagnosis Date  . Hypertension   . Depression   . Dyslipidemia   . Obesity   . Chronic kidney disease, unspecified   . Other and unspecified hyperlipidemia   . Atrial fibrillation     Past Surgical History:  Procedure Laterality Date  . ABDOMINAL HYSTERECTOMY    . CARDIAC ELECTROPHYSIOLOGY MAPPING AND ABLATION  01/2012  . CARDIOVERSION  2011  . CARDIOVERSION  12/2011  . CHOLECYSTECTOMY, LAPAROSCOPIC    . HERNIA REPAIR    . KNEE SURGERY     Right knee arthroscopy    FAMHx:  History reviewed. No pertinent family history.  SOCHx:   reports that she quit smoking about 48 years ago. Her smoking use included cigarettes. She has a 1.25 pack-year smoking history. she has never used smokeless tobacco. She reports that she drinks alcohol. Her drug history is not on file.  ALLERGIES:  No Known Allergies  ROS: Pertinent items noted in HPI and remainder of comprehensive ROS otherwise negative.  HOME MEDS: Current Outpatient Medications  Medication Sig Dispense Refill  . allopurinol (ZYLOPRIM) 300 MG tablet Take 300 mg by mouth daily.    . budesonide-formoterol (SYMBICORT) 80-4.5 MCG/ACT inhaler Inhale 2 puffs into the lungs 2 (two) times daily. 1 Inhaler 11  . diltiazem (CARDIZEM CD) 240 MG 24 hr capsule Take 1 capsule (240 mg total) by mouth daily. 90 capsule 3  . fluticasone (FLONASE) 50 MCG/ACT nasal spray Place 1 spray into both nostrils daily as needed. (sinus relief)    . furosemide (LASIX) 40 MG tablet Take 40 mg by mouth daily.     . Multiple Vitamin (MULTIVITAMIN WITH MINERALS) TABS tablet Take 1 tablet by  mouth 4 (four) times a week.    . ondansetron (ZOFRAN) 4 MG tablet Take 4 mg by mouth every 8 (eight) hours as needed for nausea or vomiting.    . pantoprazole (PROTONIX) 40 MG tablet TAKE 1 TABLET (40 MG TOTAL) BY MOUTH DAILY. TAKE 30-60 MIN BEFORE FIRST MEAL OF THE DAY 30 tablet 0  . ranitidine (ZANTAC) 150 MG capsule Take 150 mg by mouth 2 (two) times daily.    . traMADol (ULTRAM) 50 MG tablet Take 1 tablet (50 mg total) by mouth every 6 (six) hours as needed. 40 tablet 0  . triamcinolone ointment (KENALOG) 0.1 % Apply 1 application topically at bedtime.    Marland Kitchen venlafaxine (EFFEXOR) 75 MG tablet Take 75 mg by mouth 2 (two) times daily.    Marland Kitchen warfarin (COUMADIN) 5 MG tablet Take per INR 90 tablet 0   No current facility-administered medications for this visit.  LABS/IMAGING: No results found for this or any previous visit (from the past 48 hour(s)). No results found.  VITALS: BP 118/72   Pulse 77   Ht 5' 7" (1.702 m)   Wt 262 lb (118.8 kg)   BMI 41.04 kg/m   EXAM: General appearance: alert, cooperative and morbidly obese Neck: no carotid bruit and no JVD Lungs: clear to auscultation bilaterally Heart: irregularly irregular rhythm and 1/6 SEM early peaking.  Extremities: 1+ edema bilaterally, venous stasis changes Skin: Skin color, texture, turgor normal. No rashes or lesions Neurologic: Grossly normal  EKG: Sinus rhythm with PACs at 77-personally reviewed  ASSESSMENT: 1. Fatigue, daytime somnolence 2. Persistent Atrial Fibrillation (CHADSVASC score of 3)on Coumadin. S/p 2 cardioversions and ablation in January 2014.  3. Hypertension 4. Encounter for long term use of anticoagulant 5. Hyperlpidemia (last total cholesterol 208, HDL 39, LDL 127) 6. Morbid obesity with BMI near 50 kg/m2 7. Depression and recurrent falls  PLAN: 1.   Christina Bentley is to have fatigue and daytime somnolence.  I am concerned about sleep apnea or other primary sleep disorder.  She has had no  recurrent atrial fibrillation on warfarin.  Blood pressure is well controlled today.  Her weight is down another several pounds which is good.  I think she benefit from overnight oximetry (AKA a home sleep study).  We will try to arrange that.  I also recommended that she discuss with her dermatologist about possibly switching her topical steroid to [place].   Follow-up with me annually or sooner as necessary.  Pixie Casino, MD, [facility] Attending Cardiologist [facility]   12/03/2016 1:38 PM

## 2016-12-03 NOTE — Patient Instructions (Signed)
Dr Debara Pickett recommends that you continue on your current medications as directed. Please refer to the Current Medication list given to you today.  Your physician has recommended that you have a sleep study. This test records several body functions during sleep, including: brain activity, eye movement, oxygen and carbon dioxide blood levels, heart rate and rhythm, breathing rate and rhythm, the flow of air through your mouth and nose, snoring, body muscle movements, and chest and belly movement.  Dr Debara Pickett recommends that you schedule a follow-up appointment in 12 months. You will receive a reminder letter in the mail two months in advance. If you don't receive a letter, please call our office to schedule the follow-up appointment.  If you need a refill on your cardiac medications before your next appointment, please call your pharmacy.   Ask you dermatologist about Eucrisa for eczema.

## 2016-12-10 ENCOUNTER — Ambulatory Visit: Payer: Medicare Other | Attending: Internal Medicine | Admitting: Cardiovascular Disease

## 2016-12-10 DIAGNOSIS — R5383 Other fatigue: Secondary | ICD-10-CM | POA: Diagnosis not present

## 2016-12-10 DIAGNOSIS — G4733 Obstructive sleep apnea (adult) (pediatric): Secondary | ICD-10-CM

## 2016-12-12 ENCOUNTER — Ambulatory Visit (INDEPENDENT_AMBULATORY_CARE_PROVIDER_SITE_OTHER): Payer: Medicare Other | Admitting: *Deleted

## 2016-12-12 DIAGNOSIS — Z5181 Encounter for therapeutic drug level monitoring: Secondary | ICD-10-CM

## 2016-12-12 DIAGNOSIS — I4891 Unspecified atrial fibrillation: Secondary | ICD-10-CM | POA: Diagnosis not present

## 2016-12-12 DIAGNOSIS — Z23 Encounter for immunization: Secondary | ICD-10-CM | POA: Diagnosis not present

## 2016-12-12 LAB — POCT INR: INR: 2.8

## 2016-12-28 DIAGNOSIS — S62304A Unspecified fracture of fourth metacarpal bone, right hand, initial encounter for closed fracture: Secondary | ICD-10-CM | POA: Diagnosis not present

## 2016-12-28 DIAGNOSIS — I10 Essential (primary) hypertension: Secondary | ICD-10-CM | POA: Diagnosis not present

## 2016-12-28 DIAGNOSIS — Z87891 Personal history of nicotine dependence: Secondary | ICD-10-CM | POA: Diagnosis not present

## 2016-12-28 DIAGNOSIS — K219 Gastro-esophageal reflux disease without esophagitis: Secondary | ICD-10-CM | POA: Diagnosis not present

## 2016-12-28 DIAGNOSIS — R05 Cough: Secondary | ICD-10-CM | POA: Diagnosis not present

## 2016-12-28 DIAGNOSIS — Z79899 Other long term (current) drug therapy: Secondary | ICD-10-CM | POA: Diagnosis not present

## 2016-12-28 DIAGNOSIS — S299XXA Unspecified injury of thorax, initial encounter: Secondary | ICD-10-CM | POA: Diagnosis not present

## 2016-12-28 DIAGNOSIS — F419 Anxiety disorder, unspecified: Secondary | ICD-10-CM | POA: Diagnosis not present

## 2016-12-28 DIAGNOSIS — W19XXXA Unspecified fall, initial encounter: Secondary | ICD-10-CM | POA: Diagnosis not present

## 2016-12-28 DIAGNOSIS — Z7901 Long term (current) use of anticoagulants: Secondary | ICD-10-CM | POA: Diagnosis not present

## 2016-12-28 DIAGNOSIS — S6291XA Unspecified fracture of right wrist and hand, initial encounter for closed fracture: Secondary | ICD-10-CM | POA: Diagnosis not present

## 2016-12-28 DIAGNOSIS — F329 Major depressive disorder, single episode, unspecified: Secondary | ICD-10-CM | POA: Diagnosis not present

## 2016-12-28 DIAGNOSIS — R51 Headache: Secondary | ICD-10-CM | POA: Diagnosis not present

## 2016-12-28 DIAGNOSIS — R0789 Other chest pain: Secondary | ICD-10-CM | POA: Diagnosis not present

## 2016-12-28 DIAGNOSIS — R0781 Pleurodynia: Secondary | ICD-10-CM | POA: Diagnosis not present

## 2016-12-28 DIAGNOSIS — I4891 Unspecified atrial fibrillation: Secondary | ICD-10-CM | POA: Diagnosis not present

## 2016-12-28 DIAGNOSIS — R0981 Nasal congestion: Secondary | ICD-10-CM | POA: Diagnosis not present

## 2016-12-28 DIAGNOSIS — M79641 Pain in right hand: Secondary | ICD-10-CM | POA: Diagnosis not present

## 2017-01-02 ENCOUNTER — Telehealth: Payer: Self-pay | Admitting: *Deleted

## 2017-01-02 DIAGNOSIS — F329 Major depressive disorder, single episode, unspecified: Secondary | ICD-10-CM | POA: Diagnosis present

## 2017-01-02 DIAGNOSIS — T1490XA Injury, unspecified, initial encounter: Secondary | ICD-10-CM | POA: Diagnosis not present

## 2017-01-02 DIAGNOSIS — S299XXA Unspecified injury of thorax, initial encounter: Secondary | ICD-10-CM | POA: Diagnosis not present

## 2017-01-02 DIAGNOSIS — N183 Chronic kidney disease, stage 3 (moderate): Secondary | ICD-10-CM | POA: Diagnosis not present

## 2017-01-02 DIAGNOSIS — R0781 Pleurodynia: Secondary | ICD-10-CM | POA: Diagnosis not present

## 2017-01-02 DIAGNOSIS — W19XXXA Unspecified fall, initial encounter: Secondary | ICD-10-CM | POA: Diagnosis not present

## 2017-01-02 DIAGNOSIS — W1839XA Other fall on same level, initial encounter: Secondary | ICD-10-CM | POA: Diagnosis not present

## 2017-01-02 DIAGNOSIS — R791 Abnormal coagulation profile: Secondary | ICD-10-CM | POA: Diagnosis not present

## 2017-01-02 DIAGNOSIS — S62304A Unspecified fracture of fourth metacarpal bone, right hand, initial encounter for closed fracture: Secondary | ICD-10-CM | POA: Diagnosis not present

## 2017-01-02 DIAGNOSIS — Z7901 Long term (current) use of anticoagulants: Secondary | ICD-10-CM | POA: Diagnosis not present

## 2017-01-02 DIAGNOSIS — C9210 Chronic myeloid leukemia, BCR/ABL-positive, not having achieved remission: Secondary | ICD-10-CM | POA: Diagnosis not present

## 2017-01-02 DIAGNOSIS — I482 Chronic atrial fibrillation: Secondary | ICD-10-CM | POA: Diagnosis not present

## 2017-01-02 DIAGNOSIS — Z87891 Personal history of nicotine dependence: Secondary | ICD-10-CM | POA: Diagnosis not present

## 2017-01-02 DIAGNOSIS — K219 Gastro-esophageal reflux disease without esophagitis: Secondary | ICD-10-CM | POA: Diagnosis present

## 2017-01-02 DIAGNOSIS — Z79891 Long term (current) use of opiate analgesic: Secondary | ICD-10-CM | POA: Diagnosis not present

## 2017-01-02 DIAGNOSIS — N179 Acute kidney failure, unspecified: Secondary | ICD-10-CM | POA: Diagnosis not present

## 2017-01-02 DIAGNOSIS — F419 Anxiety disorder, unspecified: Secondary | ICD-10-CM | POA: Diagnosis present

## 2017-01-02 DIAGNOSIS — G629 Polyneuropathy, unspecified: Secondary | ICD-10-CM | POA: Diagnosis not present

## 2017-01-02 DIAGNOSIS — Z79899 Other long term (current) drug therapy: Secondary | ICD-10-CM | POA: Diagnosis not present

## 2017-01-02 DIAGNOSIS — S0990XA Unspecified injury of head, initial encounter: Secondary | ICD-10-CM | POA: Diagnosis not present

## 2017-01-02 DIAGNOSIS — H748X3 Other specified disorders of middle ear and mastoid, bilateral: Secondary | ICD-10-CM | POA: Diagnosis not present

## 2017-01-02 DIAGNOSIS — I129 Hypertensive chronic kidney disease with stage 1 through stage 4 chronic kidney disease, or unspecified chronic kidney disease: Secondary | ICD-10-CM | POA: Diagnosis present

## 2017-01-02 DIAGNOSIS — Z9181 History of falling: Secondary | ICD-10-CM | POA: Diagnosis not present

## 2017-01-02 DIAGNOSIS — T45515A Adverse effect of anticoagulants, initial encounter: Secondary | ICD-10-CM | POA: Diagnosis not present

## 2017-01-02 NOTE — Telephone Encounter (Signed)
-----   Message from Acquanetta Chain, LPN sent at 40/0/8676  1:51 PM EST ----- Regarding: INR Son called patient is at [facility] due to a fall. INR is 7. They are giving her Vitamin K. Just wanted to [facility] you

## 2017-01-02 NOTE — Telephone Encounter (Signed)
Noted  

## 2017-01-05 NOTE — Procedures (Signed)
[place] Sleep Study    Patient Name: Christina Bentley, Christina Bentley Date: 12/10/2016 Gender: Female D.O.B: [birth year] Age (years): 82 Referring Provider: Nadean Corwin Hilty Height (inches): 67 Interpreting Physician: Shelva Majestic MD, ABSM Weight (lbs): 262 RPSGT: Peak, Robert BMI: 41 MRN: [id number] Neck Size:  CLINICAL INFORMATION Sleep Study Type: HST  Indication for sleep study: Fatigue, OSA, Atrial Fibrillation  Epworth Sleepiness Score:  13   SLEEP STUDY TECHNIQUE A multi-channel overnight portable sleep study was performed. The channels recorded were: nasal airflow, thoracic respiratory movement, and oxygen saturation with a pulse oximetry. Snoring was also monitored.  MEDICATIONS *    allopurinol (ZYLOPRIM) 300 MG tablet    Take 300 mg by mouth daily.           *    budesonide-formoterol (SYMBICORT) 80-4.5 MCG/ACT inhaler    Inhale 2 puffs into the lungs 2 (two) times daily.    *    diltiazem (CARDIZEM CD) 240 MG 24 hr capsule    Take 1 capsule (240 mg total) by mouth daily.     *    fluticasone (FLONASE) 50 MCG/ACT nasal spray    Place 1 spray into both nostrils daily as needed. (sinus relief)           *    furosemide (LASIX) 40 MG tablet    Take 40 mg by mouth daily.            *    Multiple Vitamin (MULTIVITAMIN WITH MINERALS) TABS tablet    Take 1 tablet by mouth 4 (four) times a week.           *    ondansetron (ZOFRAN) 4 MG tablet    Take 4 mg by mouth every 8 (eight) hours as needed for nausea or vomiting.           *    pantoprazole (PROTONIX) 40 MG tablet    TAKE 1 TABLET (40 MG TOTAL) BY MOUTH DAILY. TAKE 30-60 MIN BEFORE FIRST MEAL OF THE DAY     *    ranitidine (ZANTAC) 150 MG capsule    Take 150 mg by mouth 2 (two) times daily.           *    traMADol (ULTRAM) 50 MG tablet    Take 1 tablet (50 mg total) by mouth every 6 (six) hours as needed.     *    triamcinolone ointment (KENALOG) 0.1 %    Apply 1 application topically at bedtime.           *     venlafaxine (EFFEXOR) 75 MG tablet    Take 75 mg by mouth 2 (two) times daily.           *    warfarin (COUMADIN) 5 MG tablet    Take per INR      Patient self administered medications include: N/A.  SLEEP ARCHITECTURE Patient was studied for 425.0 minutes. The sleep efficiency was 96.7 % and the patient was supine for 100%. The arousal index was 0.0 per hour.  RESPIRATORY PARAMETERS The overall AHI was 12.7 per hour, with a central apnea index of 1.0 per hour.  The oxygen nadir was 84% during sleep.  CARDIAC DATA Mean heart rate during sleep was 74.1 bpm.  IMPRESSIONS - Mild obstructive sleep apnea occurred during this study (AHI = 12.7/h). - No significant central sleep apnea occurred during this study (CAI = 1.0/h). - Moderate oxygen desaturation was  noted during this study (Min O2 = 84%). - Patient snored 98.7% during the sleep.  DIAGNOSIS - Obstructive Sleep Apnea (327.23 [G47.33 ICD-10]) - Nocturnal Hypoxemia (327.26 [G47.36 ICD-10])  RECOMMENDATIONS - In this patient with significant cardiovascular comorbidities including atrial fibrillation recommend  in lab therapeutic CPAP titration to determine optimal pressure required to alleviate sleep disordered breathing. - Efforts should be made to optimize nasal and oral pharyngeal patency. - Positional therapy avoiding supine position during sleep. - Avoid alcohol, sedatives and other CNS depressants that may worsen sleep apnea and disrupt normal sleep architecture. - Sleep hygiene should be reviewed to assess factors that may improve sleep quality. - Weight management and regular exercise should be initiated or continued.   [Electronically signed] 01/05/2017 06:39 PM  Shelva Majestic MD, [facility], [place], American Board of Sleep Medicine   NPI: [id number] [place] PH: [phone]   FX: [phone] [place]

## 2017-01-08 ENCOUNTER — Telehealth: Payer: Self-pay | Admitting: *Deleted

## 2017-01-08 DIAGNOSIS — S6291XD Unspecified fracture of right wrist and hand, subsequent encounter for fracture with routine healing: Secondary | ICD-10-CM | POA: Diagnosis not present

## 2017-01-08 DIAGNOSIS — C9210 Chronic myeloid leukemia, BCR/ABL-positive, not having achieved remission: Secondary | ICD-10-CM | POA: Diagnosis not present

## 2017-01-08 DIAGNOSIS — G629 Polyneuropathy, unspecified: Secondary | ICD-10-CM | POA: Diagnosis not present

## 2017-01-08 DIAGNOSIS — Z9181 History of falling: Secondary | ICD-10-CM | POA: Diagnosis not present

## 2017-01-08 DIAGNOSIS — F419 Anxiety disorder, unspecified: Secondary | ICD-10-CM | POA: Diagnosis not present

## 2017-01-08 DIAGNOSIS — K219 Gastro-esophageal reflux disease without esophagitis: Secondary | ICD-10-CM | POA: Diagnosis not present

## 2017-01-08 DIAGNOSIS — I482 Chronic atrial fibrillation: Secondary | ICD-10-CM | POA: Diagnosis not present

## 2017-01-08 DIAGNOSIS — F329 Major depressive disorder, single episode, unspecified: Secondary | ICD-10-CM | POA: Diagnosis not present

## 2017-01-08 DIAGNOSIS — I129 Hypertensive chronic kidney disease with stage 1 through stage 4 chronic kidney disease, or unspecified chronic kidney disease: Secondary | ICD-10-CM | POA: Diagnosis not present

## 2017-01-08 DIAGNOSIS — N183 Chronic kidney disease, stage 3 (moderate): Secondary | ICD-10-CM | POA: Diagnosis not present

## 2017-01-08 DIAGNOSIS — R296 Repeated falls: Secondary | ICD-10-CM | POA: Diagnosis not present

## 2017-01-08 NOTE — Telephone Encounter (Signed)
Spoke to patient and son-aware of results and verbalized understanding.  Patient fell recently and has a fractured arm, seeing orthopedic tomorrow and will know how long she will need to be restricted or in cast.   States this may be 4-6 weeks.  Son states they will call back to let me know when she will be able to do in lab CPAP study.  States this will be after Christmas but in Jan for sure.  Son is aware that if not completed within 6 months of original study that insurance may require a repeat study.     Patient and son aware and verbalized understanding.

## 2017-01-08 NOTE — Telephone Encounter (Signed)
-----   Message from Troy Sine, MD sent at 01/05/2017  6:46 PM EST ----- Angie Fava, please notify the patient the results of her home sleep study.  The patient should be scheduled for an in lab CPAP titration trial.

## 2017-01-08 NOTE — Progress Notes (Signed)
See telephone note 12/12

## 2017-01-09 DIAGNOSIS — Z7901 Long term (current) use of anticoagulants: Secondary | ICD-10-CM | POA: Diagnosis not present

## 2017-01-09 DIAGNOSIS — C9590 Leukemia, unspecified not having achieved remission: Secondary | ICD-10-CM | POA: Diagnosis not present

## 2017-01-09 DIAGNOSIS — S62324A Displaced fracture of shaft of fourth metacarpal bone, right hand, initial encounter for closed fracture: Secondary | ICD-10-CM | POA: Diagnosis not present

## 2017-01-09 DIAGNOSIS — M79604 Pain in right leg: Secondary | ICD-10-CM | POA: Diagnosis not present

## 2017-01-10 DIAGNOSIS — I482 Chronic atrial fibrillation: Secondary | ICD-10-CM | POA: Diagnosis not present

## 2017-01-10 DIAGNOSIS — G629 Polyneuropathy, unspecified: Secondary | ICD-10-CM | POA: Diagnosis not present

## 2017-01-10 DIAGNOSIS — R296 Repeated falls: Secondary | ICD-10-CM | POA: Diagnosis not present

## 2017-01-10 DIAGNOSIS — S6291XD Unspecified fracture of right wrist and hand, subsequent encounter for fracture with routine healing: Secondary | ICD-10-CM | POA: Diagnosis not present

## 2017-01-10 DIAGNOSIS — C9210 Chronic myeloid leukemia, BCR/ABL-positive, not having achieved remission: Secondary | ICD-10-CM | POA: Diagnosis not present

## 2017-01-10 DIAGNOSIS — I129 Hypertensive chronic kidney disease with stage 1 through stage 4 chronic kidney disease, or unspecified chronic kidney disease: Secondary | ICD-10-CM | POA: Diagnosis not present

## 2017-01-13 DIAGNOSIS — R296 Repeated falls: Secondary | ICD-10-CM | POA: Diagnosis not present

## 2017-01-13 DIAGNOSIS — R799 Abnormal finding of blood chemistry, unspecified: Secondary | ICD-10-CM | POA: Diagnosis not present

## 2017-01-13 DIAGNOSIS — N183 Chronic kidney disease, stage 3 (moderate): Secondary | ICD-10-CM | POA: Diagnosis not present

## 2017-01-13 DIAGNOSIS — M79604 Pain in right leg: Secondary | ICD-10-CM | POA: Diagnosis not present

## 2017-01-13 DIAGNOSIS — Z7901 Long term (current) use of anticoagulants: Secondary | ICD-10-CM | POA: Diagnosis not present

## 2017-01-13 DIAGNOSIS — M5441 Lumbago with sciatica, right side: Secondary | ICD-10-CM | POA: Diagnosis not present

## 2017-01-13 DIAGNOSIS — I482 Chronic atrial fibrillation: Secondary | ICD-10-CM | POA: Diagnosis not present

## 2017-01-13 DIAGNOSIS — G8929 Other chronic pain: Secondary | ICD-10-CM | POA: Diagnosis not present

## 2017-01-13 DIAGNOSIS — I129 Hypertensive chronic kidney disease with stage 1 through stage 4 chronic kidney disease, or unspecified chronic kidney disease: Secondary | ICD-10-CM | POA: Diagnosis not present

## 2017-01-13 DIAGNOSIS — G629 Polyneuropathy, unspecified: Secondary | ICD-10-CM | POA: Diagnosis not present

## 2017-01-13 DIAGNOSIS — Z5181 Encounter for therapeutic drug level monitoring: Secondary | ICD-10-CM | POA: Diagnosis not present

## 2017-01-13 DIAGNOSIS — S6291XD Unspecified fracture of right wrist and hand, subsequent encounter for fracture with routine healing: Secondary | ICD-10-CM | POA: Diagnosis not present

## 2017-01-13 DIAGNOSIS — H8113 Benign paroxysmal vertigo, bilateral: Secondary | ICD-10-CM | POA: Diagnosis not present

## 2017-01-13 DIAGNOSIS — N760 Acute vaginitis: Secondary | ICD-10-CM | POA: Diagnosis not present

## 2017-01-13 DIAGNOSIS — Z8679 Personal history of other diseases of the circulatory system: Secondary | ICD-10-CM | POA: Diagnosis not present

## 2017-01-13 DIAGNOSIS — C9210 Chronic myeloid leukemia, BCR/ABL-positive, not having achieved remission: Secondary | ICD-10-CM | POA: Diagnosis not present

## 2017-01-13 DIAGNOSIS — B373 Candidiasis of vulva and vagina: Secondary | ICD-10-CM | POA: Diagnosis not present

## 2017-01-13 DIAGNOSIS — N390 Urinary tract infection, site not specified: Secondary | ICD-10-CM | POA: Diagnosis not present

## 2017-01-13 DIAGNOSIS — C9310 Chronic myelomonocytic leukemia not having achieved remission: Secondary | ICD-10-CM | POA: Diagnosis not present

## 2017-01-15 ENCOUNTER — Telehealth: Payer: Self-pay | Admitting: *Deleted

## 2017-01-15 DIAGNOSIS — C9310 Chronic myelomonocytic leukemia not having achieved remission: Secondary | ICD-10-CM | POA: Diagnosis not present

## 2017-01-15 DIAGNOSIS — G8929 Other chronic pain: Secondary | ICD-10-CM | POA: Diagnosis not present

## 2017-01-15 DIAGNOSIS — G629 Polyneuropathy, unspecified: Secondary | ICD-10-CM | POA: Diagnosis not present

## 2017-01-15 DIAGNOSIS — S6291XD Unspecified fracture of right wrist and hand, subsequent encounter for fracture with routine healing: Secondary | ICD-10-CM | POA: Diagnosis not present

## 2017-01-15 DIAGNOSIS — M47896 Other spondylosis, lumbar region: Secondary | ICD-10-CM | POA: Diagnosis not present

## 2017-01-15 DIAGNOSIS — M5117 Intervertebral disc disorders with radiculopathy, lumbosacral region: Secondary | ICD-10-CM | POA: Diagnosis not present

## 2017-01-15 DIAGNOSIS — M5116 Intervertebral disc disorders with radiculopathy, lumbar region: Secondary | ICD-10-CM | POA: Diagnosis not present

## 2017-01-15 DIAGNOSIS — M4726 Other spondylosis with radiculopathy, lumbar region: Secondary | ICD-10-CM | POA: Diagnosis not present

## 2017-01-15 DIAGNOSIS — I482 Chronic atrial fibrillation: Secondary | ICD-10-CM | POA: Diagnosis not present

## 2017-01-15 DIAGNOSIS — M2540 Effusion, unspecified joint: Secondary | ICD-10-CM | POA: Diagnosis not present

## 2017-01-15 DIAGNOSIS — C9210 Chronic myeloid leukemia, BCR/ABL-positive, not having achieved remission: Secondary | ICD-10-CM | POA: Diagnosis not present

## 2017-01-15 DIAGNOSIS — M79604 Pain in right leg: Secondary | ICD-10-CM | POA: Diagnosis not present

## 2017-01-15 DIAGNOSIS — M5441 Lumbago with sciatica, right side: Secondary | ICD-10-CM | POA: Diagnosis not present

## 2017-01-15 DIAGNOSIS — M48061 Spinal stenosis, lumbar region without neurogenic claudication: Secondary | ICD-10-CM | POA: Diagnosis not present

## 2017-01-15 DIAGNOSIS — I129 Hypertensive chronic kidney disease with stage 1 through stage 4 chronic kidney disease, or unspecified chronic kidney disease: Secondary | ICD-10-CM | POA: Diagnosis not present

## 2017-01-15 DIAGNOSIS — R296 Repeated falls: Secondary | ICD-10-CM | POA: Diagnosis not present

## 2017-01-15 DIAGNOSIS — M4727 Other spondylosis with radiculopathy, lumbosacral region: Secondary | ICD-10-CM | POA: Diagnosis not present

## 2017-01-15 NOTE — Telephone Encounter (Signed)
Per [facility] Nurse.  Coumadin is on hold per Dr Case for hand surgery.  She will resume coumadin after procedure with INR check in 5-7 days by [facility] Nurse.

## 2017-01-15 NOTE — Telephone Encounter (Signed)
INR - 1.3 / please call with instructions / tg

## 2017-01-16 DIAGNOSIS — G629 Polyneuropathy, unspecified: Secondary | ICD-10-CM | POA: Diagnosis not present

## 2017-01-16 DIAGNOSIS — M1990 Unspecified osteoarthritis, unspecified site: Secondary | ICD-10-CM | POA: Diagnosis not present

## 2017-01-16 DIAGNOSIS — F419 Anxiety disorder, unspecified: Secondary | ICD-10-CM | POA: Diagnosis not present

## 2017-01-16 DIAGNOSIS — F329 Major depressive disorder, single episode, unspecified: Secondary | ICD-10-CM | POA: Diagnosis not present

## 2017-01-16 DIAGNOSIS — M810 Age-related osteoporosis without current pathological fracture: Secondary | ICD-10-CM | POA: Diagnosis not present

## 2017-01-16 DIAGNOSIS — I10 Essential (primary) hypertension: Secondary | ICD-10-CM | POA: Diagnosis not present

## 2017-01-16 DIAGNOSIS — C9210 Chronic myeloid leukemia, BCR/ABL-positive, not having achieved remission: Secondary | ICD-10-CM | POA: Diagnosis not present

## 2017-01-16 DIAGNOSIS — Z7901 Long term (current) use of anticoagulants: Secondary | ICD-10-CM | POA: Diagnosis not present

## 2017-01-16 DIAGNOSIS — E669 Obesity, unspecified: Secondary | ICD-10-CM | POA: Diagnosis not present

## 2017-01-16 DIAGNOSIS — I4891 Unspecified atrial fibrillation: Secondary | ICD-10-CM | POA: Diagnosis not present

## 2017-01-16 DIAGNOSIS — M109 Gout, unspecified: Secondary | ICD-10-CM | POA: Diagnosis not present

## 2017-01-16 DIAGNOSIS — S62324A Displaced fracture of shaft of fourth metacarpal bone, right hand, initial encounter for closed fracture: Secondary | ICD-10-CM | POA: Diagnosis not present

## 2017-01-16 DIAGNOSIS — K219 Gastro-esophageal reflux disease without esophagitis: Secondary | ICD-10-CM | POA: Diagnosis not present

## 2017-01-16 DIAGNOSIS — E785 Hyperlipidemia, unspecified: Secondary | ICD-10-CM | POA: Diagnosis not present

## 2017-01-16 DIAGNOSIS — Z6839 Body mass index (BMI) 39.0-39.9, adult: Secondary | ICD-10-CM | POA: Diagnosis not present

## 2017-01-16 DIAGNOSIS — Z79899 Other long term (current) drug therapy: Secondary | ICD-10-CM | POA: Diagnosis not present

## 2017-01-17 DIAGNOSIS — G629 Polyneuropathy, unspecified: Secondary | ICD-10-CM | POA: Diagnosis not present

## 2017-01-17 DIAGNOSIS — C9210 Chronic myeloid leukemia, BCR/ABL-positive, not having achieved remission: Secondary | ICD-10-CM | POA: Diagnosis not present

## 2017-01-17 DIAGNOSIS — S62324A Displaced fracture of shaft of fourth metacarpal bone, right hand, initial encounter for closed fracture: Secondary | ICD-10-CM | POA: Diagnosis not present

## 2017-01-17 DIAGNOSIS — Z79899 Other long term (current) drug therapy: Secondary | ICD-10-CM | POA: Diagnosis not present

## 2017-01-17 DIAGNOSIS — Z7901 Long term (current) use of anticoagulants: Secondary | ICD-10-CM | POA: Diagnosis not present

## 2017-01-17 DIAGNOSIS — M810 Age-related osteoporosis without current pathological fracture: Secondary | ICD-10-CM | POA: Diagnosis not present

## 2017-01-18 DIAGNOSIS — S62306D Unspecified fracture of fifth metacarpal bone, right hand, subsequent encounter for fracture with routine healing: Secondary | ICD-10-CM | POA: Diagnosis not present

## 2017-01-27 DIAGNOSIS — Z8679 Personal history of other diseases of the circulatory system: Secondary | ICD-10-CM | POA: Diagnosis not present

## 2017-01-27 DIAGNOSIS — N390 Urinary tract infection, site not specified: Secondary | ICD-10-CM | POA: Diagnosis not present

## 2017-01-27 DIAGNOSIS — M5441 Lumbago with sciatica, right side: Secondary | ICD-10-CM | POA: Diagnosis not present

## 2017-01-27 DIAGNOSIS — N760 Acute vaginitis: Secondary | ICD-10-CM | POA: Diagnosis not present

## 2017-01-27 DIAGNOSIS — R799 Abnormal finding of blood chemistry, unspecified: Secondary | ICD-10-CM | POA: Diagnosis not present

## 2017-01-27 DIAGNOSIS — B373 Candidiasis of vulva and vagina: Secondary | ICD-10-CM | POA: Diagnosis not present

## 2017-01-27 DIAGNOSIS — R296 Repeated falls: Secondary | ICD-10-CM | POA: Diagnosis not present

## 2017-01-27 DIAGNOSIS — Z5181 Encounter for therapeutic drug level monitoring: Secondary | ICD-10-CM | POA: Diagnosis not present

## 2017-01-27 DIAGNOSIS — Z7901 Long term (current) use of anticoagulants: Secondary | ICD-10-CM | POA: Diagnosis not present

## 2017-01-27 DIAGNOSIS — C9310 Chronic myelomonocytic leukemia not having achieved remission: Secondary | ICD-10-CM | POA: Diagnosis not present

## 2017-01-27 DIAGNOSIS — N183 Chronic kidney disease, stage 3 (moderate): Secondary | ICD-10-CM | POA: Diagnosis not present

## 2017-01-27 DIAGNOSIS — H8113 Benign paroxysmal vertigo, bilateral: Secondary | ICD-10-CM | POA: Diagnosis not present

## 2017-01-27 DIAGNOSIS — M79604 Pain in right leg: Secondary | ICD-10-CM | POA: Diagnosis not present

## 2017-01-27 DIAGNOSIS — G8929 Other chronic pain: Secondary | ICD-10-CM | POA: Diagnosis not present

## 2017-01-30 ENCOUNTER — Ambulatory Visit (INDEPENDENT_AMBULATORY_CARE_PROVIDER_SITE_OTHER): Payer: Medicare Other | Admitting: *Deleted

## 2017-01-30 ENCOUNTER — Telehealth: Payer: Self-pay | Admitting: Internal Medicine

## 2017-01-30 DIAGNOSIS — I482 Chronic atrial fibrillation: Secondary | ICD-10-CM | POA: Diagnosis not present

## 2017-01-30 DIAGNOSIS — G629 Polyneuropathy, unspecified: Secondary | ICD-10-CM | POA: Diagnosis not present

## 2017-01-30 DIAGNOSIS — I4891 Unspecified atrial fibrillation: Secondary | ICD-10-CM

## 2017-01-30 DIAGNOSIS — S6291XD Unspecified fracture of right wrist and hand, subsequent encounter for fracture with routine healing: Secondary | ICD-10-CM | POA: Diagnosis not present

## 2017-01-30 DIAGNOSIS — C9210 Chronic myeloid leukemia, BCR/ABL-positive, not having achieved remission: Secondary | ICD-10-CM | POA: Diagnosis not present

## 2017-01-30 DIAGNOSIS — I129 Hypertensive chronic kidney disease with stage 1 through stage 4 chronic kidney disease, or unspecified chronic kidney disease: Secondary | ICD-10-CM | POA: Diagnosis not present

## 2017-01-30 DIAGNOSIS — Z5181 Encounter for therapeutic drug level monitoring: Secondary | ICD-10-CM | POA: Diagnosis not present

## 2017-01-30 DIAGNOSIS — R296 Repeated falls: Secondary | ICD-10-CM | POA: Diagnosis not present

## 2017-01-30 LAB — POCT INR: INR: 1.5

## 2017-01-30 NOTE — Telephone Encounter (Signed)
Done.  See coumadin note. 

## 2017-01-30 NOTE — Telephone Encounter (Signed)
PT 17.8 INR 1.5

## 2017-01-30 NOTE — Patient Instructions (Signed)
Continue coumadin 1 tablet daily except 1/2 tablet Tuesdays, Thursdays and Saturdays On Cipro and Diflucan Recheck on Monday 02/03/17

## 2017-02-03 ENCOUNTER — Ambulatory Visit (INDEPENDENT_AMBULATORY_CARE_PROVIDER_SITE_OTHER): Payer: Medicare Other | Admitting: *Deleted

## 2017-02-03 ENCOUNTER — Telehealth: Payer: Self-pay | Admitting: *Deleted

## 2017-02-03 DIAGNOSIS — G629 Polyneuropathy, unspecified: Secondary | ICD-10-CM | POA: Diagnosis not present

## 2017-02-03 DIAGNOSIS — Z5181 Encounter for therapeutic drug level monitoring: Secondary | ICD-10-CM | POA: Diagnosis not present

## 2017-02-03 DIAGNOSIS — C9210 Chronic myeloid leukemia, BCR/ABL-positive, not having achieved remission: Secondary | ICD-10-CM | POA: Diagnosis not present

## 2017-02-03 DIAGNOSIS — S6291XD Unspecified fracture of right wrist and hand, subsequent encounter for fracture with routine healing: Secondary | ICD-10-CM | POA: Diagnosis not present

## 2017-02-03 DIAGNOSIS — I4891 Unspecified atrial fibrillation: Secondary | ICD-10-CM | POA: Diagnosis not present

## 2017-02-03 DIAGNOSIS — R296 Repeated falls: Secondary | ICD-10-CM | POA: Diagnosis not present

## 2017-02-03 DIAGNOSIS — S62324D Displaced fracture of shaft of fourth metacarpal bone, right hand, subsequent encounter for fracture with routine healing: Secondary | ICD-10-CM | POA: Diagnosis not present

## 2017-02-03 DIAGNOSIS — I129 Hypertensive chronic kidney disease with stage 1 through stage 4 chronic kidney disease, or unspecified chronic kidney disease: Secondary | ICD-10-CM | POA: Diagnosis not present

## 2017-02-03 DIAGNOSIS — I482 Chronic atrial fibrillation: Secondary | ICD-10-CM | POA: Diagnosis not present

## 2017-02-03 LAB — POCT INR: INR: 2.5

## 2017-02-03 NOTE — Telephone Encounter (Signed)
Done.  See coumadin note. 

## 2017-02-03 NOTE — Patient Instructions (Signed)
Continue coumadin 1 tablet daily except 1/2 tablet Tuesdays, Thursdays and Saturdays Finished Cipro and Diflucan Recheck in 1 week

## 2017-02-03 NOTE — Telephone Encounter (Signed)
Per [place] w/ [facility] please give her a call @ [phone]

## 2017-02-04 DIAGNOSIS — M130 Polyarthritis, unspecified: Secondary | ICD-10-CM | POA: Diagnosis not present

## 2017-02-04 DIAGNOSIS — E559 Vitamin D deficiency, unspecified: Secondary | ICD-10-CM | POA: Diagnosis not present

## 2017-02-04 DIAGNOSIS — M1009 Idiopathic gout, multiple sites: Secondary | ICD-10-CM | POA: Diagnosis not present

## 2017-02-04 DIAGNOSIS — I10 Essential (primary) hypertension: Secondary | ICD-10-CM | POA: Diagnosis not present

## 2017-02-04 DIAGNOSIS — I4891 Unspecified atrial fibrillation: Secondary | ICD-10-CM | POA: Diagnosis not present

## 2017-02-04 DIAGNOSIS — C9220 Atypical chronic myeloid leukemia, BCR/ABL-negative, not having achieved remission: Secondary | ICD-10-CM | POA: Diagnosis not present

## 2017-02-04 DIAGNOSIS — G629 Polyneuropathy, unspecified: Secondary | ICD-10-CM | POA: Diagnosis not present

## 2017-02-04 DIAGNOSIS — C92Z0 Other myeloid leukemia not having achieved remission: Secondary | ICD-10-CM | POA: Diagnosis not present

## 2017-02-04 DIAGNOSIS — Z23 Encounter for immunization: Secondary | ICD-10-CM | POA: Diagnosis not present

## 2017-02-10 ENCOUNTER — Ambulatory Visit (INDEPENDENT_AMBULATORY_CARE_PROVIDER_SITE_OTHER): Payer: Medicare Other | Admitting: *Deleted

## 2017-02-10 DIAGNOSIS — I4891 Unspecified atrial fibrillation: Secondary | ICD-10-CM | POA: Diagnosis not present

## 2017-02-10 DIAGNOSIS — R296 Repeated falls: Secondary | ICD-10-CM | POA: Diagnosis not present

## 2017-02-10 DIAGNOSIS — G629 Polyneuropathy, unspecified: Secondary | ICD-10-CM | POA: Diagnosis not present

## 2017-02-10 DIAGNOSIS — I482 Chronic atrial fibrillation: Secondary | ICD-10-CM | POA: Diagnosis not present

## 2017-02-10 DIAGNOSIS — Z5181 Encounter for therapeutic drug level monitoring: Secondary | ICD-10-CM

## 2017-02-10 DIAGNOSIS — S6291XD Unspecified fracture of right wrist and hand, subsequent encounter for fracture with routine healing: Secondary | ICD-10-CM | POA: Diagnosis not present

## 2017-02-10 DIAGNOSIS — Z79899 Other long term (current) drug therapy: Secondary | ICD-10-CM | POA: Diagnosis not present

## 2017-02-10 DIAGNOSIS — C9210 Chronic myeloid leukemia, BCR/ABL-positive, not having achieved remission: Secondary | ICD-10-CM | POA: Diagnosis not present

## 2017-02-10 DIAGNOSIS — I129 Hypertensive chronic kidney disease with stage 1 through stage 4 chronic kidney disease, or unspecified chronic kidney disease: Secondary | ICD-10-CM | POA: Diagnosis not present

## 2017-02-10 LAB — POCT INR

## 2017-02-10 NOTE — Patient Instructions (Signed)
POC INR >8.0   HH RN obtained blood sample to [facility] for STAT INR  Results: 7.5 Pt to hold coumadin until repeat INR check on 02/13/17 Order given to [employer] RN [place]  (Thinks pt might have doubled up on med by mistake)

## 2017-02-12 ENCOUNTER — Other Ambulatory Visit (HOSPITAL_COMMUNITY): Payer: Self-pay | Admitting: Neurosurgery

## 2017-02-12 ENCOUNTER — Telehealth: Payer: Self-pay | Admitting: Internal Medicine

## 2017-02-12 DIAGNOSIS — Z780 Asymptomatic menopausal state: Secondary | ICD-10-CM

## 2017-02-12 DIAGNOSIS — M4716 Other spondylosis with myelopathy, lumbar region: Secondary | ICD-10-CM | POA: Diagnosis not present

## 2017-02-12 DIAGNOSIS — M48062 Spinal stenosis, lumbar region with neurogenic claudication: Secondary | ICD-10-CM | POA: Diagnosis not present

## 2017-02-12 DIAGNOSIS — M859 Disorder of bone density and structure, unspecified: Secondary | ICD-10-CM | POA: Diagnosis not present

## 2017-02-12 DIAGNOSIS — M4316 Spondylolisthesis, lumbar region: Secondary | ICD-10-CM | POA: Diagnosis not present

## 2017-02-12 NOTE — Telephone Encounter (Signed)
New Message      [facility] Pre-operative Risk Assessment    Request for surgical clearance:  What type of surgery is being performed? Lumbar Spine 1. When is this surgery scheduled? Late Feb. No exact date  2. Are there any medications that need to be held prior to surgery and how long?*Coumadin  3. Practice name and name of physician performing surgery? [facility] [place]. Dr. Carloyn Manner  What is your office phone and fax number? Office number [phone] Fax [phone] 4. Anesthesia type (None, local, MAC, general) ? Not sure yet   Avaletta L Williams 02/12/2017, 4:24 PM  _________________________________________________________________   (provider comments below)

## 2017-02-13 ENCOUNTER — Ambulatory Visit (INDEPENDENT_AMBULATORY_CARE_PROVIDER_SITE_OTHER): Payer: Medicare Other | Admitting: *Deleted

## 2017-02-13 ENCOUNTER — Telehealth: Payer: Self-pay | Admitting: Cardiology

## 2017-02-13 DIAGNOSIS — Z5181 Encounter for therapeutic drug level monitoring: Secondary | ICD-10-CM | POA: Diagnosis not present

## 2017-02-13 DIAGNOSIS — I4891 Unspecified atrial fibrillation: Secondary | ICD-10-CM

## 2017-02-13 DIAGNOSIS — I129 Hypertensive chronic kidney disease with stage 1 through stage 4 chronic kidney disease, or unspecified chronic kidney disease: Secondary | ICD-10-CM | POA: Diagnosis not present

## 2017-02-13 DIAGNOSIS — G629 Polyneuropathy, unspecified: Secondary | ICD-10-CM | POA: Diagnosis not present

## 2017-02-13 DIAGNOSIS — S6291XD Unspecified fracture of right wrist and hand, subsequent encounter for fracture with routine healing: Secondary | ICD-10-CM | POA: Diagnosis not present

## 2017-02-13 DIAGNOSIS — C9210 Chronic myeloid leukemia, BCR/ABL-positive, not having achieved remission: Secondary | ICD-10-CM | POA: Diagnosis not present

## 2017-02-13 DIAGNOSIS — I482 Chronic atrial fibrillation: Secondary | ICD-10-CM | POA: Diagnosis not present

## 2017-02-13 DIAGNOSIS — R296 Repeated falls: Secondary | ICD-10-CM | POA: Diagnosis not present

## 2017-02-13 LAB — POCT INR: INR: 5.1

## 2017-02-13 NOTE — Telephone Encounter (Signed)
I called pt, she does not have CAD and is not having any chest pain. OK for back surgery from cardiac standpoint without further cardiac work up. I'll route to pharmacy about holding her Coumadin pre op.   Kerin Ransom PA-C 02/13/2017 4:05 PM

## 2017-02-13 NOTE — Patient Instructions (Signed)
Hold coumadin tonight and tomorrow night.  Take 5mg  Saturday night, 2.5mg  Sunday night and recheck INR on Monday Order given to [employer] RN [facility]

## 2017-02-14 DIAGNOSIS — M438X6 Other specified deforming dorsopathies, lumbar region: Secondary | ICD-10-CM | POA: Diagnosis not present

## 2017-02-14 DIAGNOSIS — M4316 Spondylolisthesis, lumbar region: Secondary | ICD-10-CM | POA: Diagnosis not present

## 2017-02-14 DIAGNOSIS — M47816 Spondylosis without myelopathy or radiculopathy, lumbar region: Secondary | ICD-10-CM | POA: Diagnosis not present

## 2017-02-14 DIAGNOSIS — R296 Repeated falls: Secondary | ICD-10-CM | POA: Diagnosis not present

## 2017-02-14 DIAGNOSIS — M48061 Spinal stenosis, lumbar region without neurogenic claudication: Secondary | ICD-10-CM | POA: Diagnosis not present

## 2017-02-14 DIAGNOSIS — M5136 Other intervertebral disc degeneration, lumbar region: Secondary | ICD-10-CM | POA: Diagnosis not present

## 2017-02-14 NOTE — Telephone Encounter (Signed)
I lmom for the pt that she has been cleared for her surgery and that we will fax over clearance to the surgeon today.

## 2017-02-14 NOTE — Telephone Encounter (Signed)
I will fax over to the surgeon clearance and coumadin instructions for the pt.

## 2017-02-14 NOTE — Telephone Encounter (Signed)
Original anticoag request is in 1/16 phone note - pt is pending ESI but date is not set yet.   Pt takes Coumadin for afib with CHADS2VASc score of 3 (age, sex, HTN). Ok to hold Coumadin for 5 days prior to injection without Lovenox bridge.      Documentation     Erlene Quan, PA-C sent to Cv Div Preop Callback; Cv Div Preop Pharm; Young, Tierica T, CMA 22 hours ago (4:06 PM)    Please send response to Dr Glenna Fellows in [place] (Routing comment)     Rosalyn Gess, Doreene Burke, PA-C 22 hours ago (4:00 PM)      I called pt, she does not have CAD and is not having any chest pain. OK for back surgery from cardiac standpoint without further cardiac work up. I'll route to pharmacy about holding her Coumadin pre op.   Kerin Ransom PA-C 02/13/2017 4:05 PM       Documentation     [place], Doreene Burke, PA-C contacted Quillian Quince

## 2017-02-14 NOTE — Telephone Encounter (Signed)
Original anticoag request is in 1/16 phone note - pt is pending ESI but date is not set yet.   Pt takes Coumadin for afib with CHADS2VASc score of 3 (age, sex, HTN). Ok to hold Coumadin for 5 days prior to injection without Lovenox bridge.

## 2017-02-17 ENCOUNTER — Ambulatory Visit (INDEPENDENT_AMBULATORY_CARE_PROVIDER_SITE_OTHER): Payer: Medicare Other | Admitting: *Deleted

## 2017-02-17 ENCOUNTER — Telehealth: Payer: Self-pay | Admitting: Internal Medicine

## 2017-02-17 DIAGNOSIS — Z5181 Encounter for therapeutic drug level monitoring: Secondary | ICD-10-CM | POA: Diagnosis not present

## 2017-02-17 DIAGNOSIS — I4891 Unspecified atrial fibrillation: Secondary | ICD-10-CM

## 2017-02-17 DIAGNOSIS — G629 Polyneuropathy, unspecified: Secondary | ICD-10-CM | POA: Diagnosis not present

## 2017-02-17 DIAGNOSIS — S6291XD Unspecified fracture of right wrist and hand, subsequent encounter for fracture with routine healing: Secondary | ICD-10-CM | POA: Diagnosis not present

## 2017-02-17 DIAGNOSIS — I482 Chronic atrial fibrillation: Secondary | ICD-10-CM | POA: Diagnosis not present

## 2017-02-17 DIAGNOSIS — C9210 Chronic myeloid leukemia, BCR/ABL-positive, not having achieved remission: Secondary | ICD-10-CM | POA: Diagnosis not present

## 2017-02-17 DIAGNOSIS — I129 Hypertensive chronic kidney disease with stage 1 through stage 4 chronic kidney disease, or unspecified chronic kidney disease: Secondary | ICD-10-CM | POA: Diagnosis not present

## 2017-02-17 DIAGNOSIS — R296 Repeated falls: Secondary | ICD-10-CM | POA: Diagnosis not present

## 2017-02-17 LAB — POCT INR: INR: 1.8

## 2017-02-17 NOTE — Patient Instructions (Signed)
Restart coumadin 5mg  daily except 2.5mg  on Tuesdays, Thursdays and Saturdays Order given to [employer] RN [facility]

## 2017-02-17 NOTE — Telephone Encounter (Signed)
Called in PT/INR  1.8  [place]   [phone]

## 2017-02-17 NOTE — Telephone Encounter (Signed)
Done.  See coumadin note. 

## 2017-02-18 LAB — POCT INR: INR: 3.7

## 2017-02-20 ENCOUNTER — Ambulatory Visit (HOSPITAL_COMMUNITY)
Admission: RE | Admit: 2017-02-20 | Discharge: 2017-02-20 | Disposition: A | Discharge status: Home / Self Care | Payer: Medicare Other | Source: Ambulatory Visit | Attending: Neurosurgery | Admitting: Neurosurgery

## 2017-02-20 DIAGNOSIS — Z780 Asymptomatic menopausal state: Secondary | ICD-10-CM

## 2017-02-20 DIAGNOSIS — M85852 Other specified disorders of bone density and structure, left thigh: Secondary | ICD-10-CM | POA: Insufficient documentation

## 2017-02-25 ENCOUNTER — Ambulatory Visit (INDEPENDENT_AMBULATORY_CARE_PROVIDER_SITE_OTHER): Payer: Medicare Other | Admitting: *Deleted

## 2017-02-25 ENCOUNTER — Telehealth: Payer: Self-pay | Admitting: *Deleted

## 2017-02-25 DIAGNOSIS — I4891 Unspecified atrial fibrillation: Secondary | ICD-10-CM

## 2017-02-25 DIAGNOSIS — Z5181 Encounter for therapeutic drug level monitoring: Secondary | ICD-10-CM | POA: Diagnosis not present

## 2017-02-25 DIAGNOSIS — I129 Hypertensive chronic kidney disease with stage 1 through stage 4 chronic kidney disease, or unspecified chronic kidney disease: Secondary | ICD-10-CM | POA: Diagnosis not present

## 2017-02-25 DIAGNOSIS — G629 Polyneuropathy, unspecified: Secondary | ICD-10-CM | POA: Diagnosis not present

## 2017-02-25 DIAGNOSIS — C9210 Chronic myeloid leukemia, BCR/ABL-positive, not having achieved remission: Secondary | ICD-10-CM | POA: Diagnosis not present

## 2017-02-25 DIAGNOSIS — S6291XD Unspecified fracture of right wrist and hand, subsequent encounter for fracture with routine healing: Secondary | ICD-10-CM | POA: Diagnosis not present

## 2017-02-25 DIAGNOSIS — I482 Chronic atrial fibrillation: Secondary | ICD-10-CM | POA: Diagnosis not present

## 2017-02-25 DIAGNOSIS — R296 Repeated falls: Secondary | ICD-10-CM | POA: Diagnosis not present

## 2017-02-25 LAB — POCT INR: INR: 3.7

## 2017-02-25 NOTE — Telephone Encounter (Signed)
INR 3.7 PT 44.0

## 2017-02-25 NOTE — Patient Instructions (Signed)
ERROR: INR of 3.7 on 1/22 entered in error.  Was 1/29 Hold coumadin tonight then decrease dose to 2.5mg  daily except 5mg  on Mondays, Wednesdays and Fridays Order given to [employer] LPN [facility]

## 2017-02-25 NOTE — Telephone Encounter (Signed)
Done.  See coumadin note. 

## 2017-02-26 ENCOUNTER — Telehealth: Payer: Self-pay | Admitting: *Deleted

## 2017-02-26 NOTE — Telephone Encounter (Addendum)
-----   Message from Troy Sine, MD sent at 01/05/2017  6:46 PM EST ----- Christina Bentley, please notify the patient the results of her home sleep study.  The patient should be scheduled for an in lab CPAP titration trial.   Spoke with pt husband, he reports they do not want anymore testing right now. The patient has fallen and has a couple broken bones and may have to have back surgery. They will call when they would like to continue with the sleep evaluation.

## 2017-02-28 NOTE — Progress Notes (Signed)
See telephone note 1/30 

## 2017-03-05 DIAGNOSIS — S6291XD Unspecified fracture of right wrist and hand, subsequent encounter for fracture with routine healing: Secondary | ICD-10-CM | POA: Diagnosis not present

## 2017-03-05 DIAGNOSIS — I129 Hypertensive chronic kidney disease with stage 1 through stage 4 chronic kidney disease, or unspecified chronic kidney disease: Secondary | ICD-10-CM | POA: Diagnosis not present

## 2017-03-05 DIAGNOSIS — G629 Polyneuropathy, unspecified: Secondary | ICD-10-CM | POA: Diagnosis not present

## 2017-03-05 DIAGNOSIS — I482 Chronic atrial fibrillation: Secondary | ICD-10-CM | POA: Diagnosis not present

## 2017-03-05 DIAGNOSIS — C9210 Chronic myeloid leukemia, BCR/ABL-positive, not having achieved remission: Secondary | ICD-10-CM | POA: Diagnosis not present

## 2017-03-05 DIAGNOSIS — R296 Repeated falls: Secondary | ICD-10-CM | POA: Diagnosis not present

## 2017-03-06 DIAGNOSIS — S62324D Displaced fracture of shaft of fourth metacarpal bone, right hand, subsequent encounter for fracture with routine healing: Secondary | ICD-10-CM | POA: Diagnosis not present

## 2017-03-10 DIAGNOSIS — I10 Essential (primary) hypertension: Secondary | ICD-10-CM | POA: Diagnosis not present

## 2017-03-10 DIAGNOSIS — I4891 Unspecified atrial fibrillation: Secondary | ICD-10-CM | POA: Diagnosis not present

## 2017-03-10 DIAGNOSIS — R5383 Other fatigue: Secondary | ICD-10-CM | POA: Diagnosis not present

## 2017-03-18 DIAGNOSIS — C9310 Chronic myelomonocytic leukemia not having achieved remission: Secondary | ICD-10-CM | POA: Diagnosis not present

## 2017-03-20 ENCOUNTER — Ambulatory Visit (INDEPENDENT_AMBULATORY_CARE_PROVIDER_SITE_OTHER): Payer: Medicare Other | Admitting: *Deleted

## 2017-03-20 DIAGNOSIS — I4891 Unspecified atrial fibrillation: Secondary | ICD-10-CM | POA: Diagnosis not present

## 2017-03-20 DIAGNOSIS — Z5181 Encounter for therapeutic drug level monitoring: Secondary | ICD-10-CM

## 2017-03-20 LAB — POCT INR: INR: 2.4

## 2017-03-20 NOTE — Patient Instructions (Signed)
Continue coumadin 1/2 tablet daily except 1 tablet on Mondays, Wednesdays and Fridays Recheck in 3 weeks Pending back surgery by Dr Carloyn Manner

## 2017-03-21 DIAGNOSIS — M4716 Other spondylosis with myelopathy, lumbar region: Secondary | ICD-10-CM | POA: Diagnosis not present

## 2017-03-21 DIAGNOSIS — M48062 Spinal stenosis, lumbar region with neurogenic claudication: Secondary | ICD-10-CM | POA: Diagnosis not present

## 2017-03-21 DIAGNOSIS — M4316 Spondylolisthesis, lumbar region: Secondary | ICD-10-CM | POA: Diagnosis not present

## 2017-04-01 ENCOUNTER — Telehealth: Payer: Self-pay | Admitting: *Deleted

## 2017-04-01 NOTE — Telephone Encounter (Signed)
Pt called and is scheduled to have lower back surgery by Dr Maia Plan 04/14/17 at [facility].  She will hold coumadin 5 days before procedure per Dr Carloyn Manner.  Take last dose of coumadin on 04/08/17.  She will be in hospital 4-5 days and possibly go to rehab after. Tentative INR appt made for 04/29/17.  Pt will cancel appt if she is in rehab.

## 2017-04-09 DIAGNOSIS — M8580 Other specified disorders of bone density and structure, unspecified site: Secondary | ICD-10-CM | POA: Diagnosis not present

## 2017-04-09 DIAGNOSIS — E559 Vitamin D deficiency, unspecified: Secondary | ICD-10-CM | POA: Diagnosis not present

## 2017-04-09 DIAGNOSIS — R5383 Other fatigue: Secondary | ICD-10-CM | POA: Diagnosis not present

## 2017-04-10 DIAGNOSIS — Z01818 Encounter for other preprocedural examination: Secondary | ICD-10-CM | POA: Diagnosis not present

## 2017-04-10 DIAGNOSIS — R05 Cough: Secondary | ICD-10-CM | POA: Diagnosis not present

## 2017-04-10 DIAGNOSIS — M4316 Spondylolisthesis, lumbar region: Secondary | ICD-10-CM | POA: Diagnosis not present

## 2017-04-11 ENCOUNTER — Telehealth: Payer: Self-pay

## 2017-04-11 NOTE — Telephone Encounter (Signed)
Christina Gauze, RN  04/01/17 4:30 PM Telephone Note : Pt called and is scheduled to have lower back surgery by Dr Maia Plan 04/14/17 at [facility].  She will hold coumadin 5 days before procedure per Dr Carloyn Manner.  Take last dose of coumadin on 04/08/17.  She will be in hospital 4-5 days and possibly go to rehab after. Tentative INR appt made for 04/29/17.  Pt will cancel appt if she is in rehab.    [facility] Pre-operative Risk Assessment    Request for surgical clearance:  1. What type of surgery is being performed?  lower back surgery  2. When is this surgery scheduled? 04-14-2017 6am  What type of clearance is required (medical clearance vs. Pharmacy clearance to hold med vs. Both)? Both- 3. Are there any medications that need to be held prior to surgery and how long?   coumadin directions above, still takes lasix  4. Practice name and name of physician performing surgery? [facility]   5. What is your office phone and fax number? [phone] fx [phone]   6. Anesthesia type (None, local, MAC, general) ? Not listed  Records already faxed by medical records   Christina Bentley 04/11/2017, 9:23 AM  _________________________________________________________________   (provider comments below)

## 2017-04-14 DIAGNOSIS — Z7951 Long term (current) use of inhaled steroids: Secondary | ICD-10-CM | POA: Diagnosis not present

## 2017-04-14 DIAGNOSIS — I4891 Unspecified atrial fibrillation: Secondary | ICD-10-CM | POA: Diagnosis present

## 2017-04-14 DIAGNOSIS — Z981 Arthrodesis status: Secondary | ICD-10-CM | POA: Diagnosis not present

## 2017-04-14 DIAGNOSIS — R2689 Other abnormalities of gait and mobility: Secondary | ICD-10-CM | POA: Diagnosis not present

## 2017-04-14 DIAGNOSIS — Z79891 Long term (current) use of opiate analgesic: Secondary | ICD-10-CM | POA: Diagnosis not present

## 2017-04-14 DIAGNOSIS — M47816 Spondylosis without myelopathy or radiculopathy, lumbar region: Secondary | ICD-10-CM | POA: Diagnosis not present

## 2017-04-14 DIAGNOSIS — C9210 Chronic myeloid leukemia, BCR/ABL-positive, not having achieved remission: Secondary | ICD-10-CM | POA: Diagnosis not present

## 2017-04-14 DIAGNOSIS — L309 Dermatitis, unspecified: Secondary | ICD-10-CM | POA: Diagnosis not present

## 2017-04-14 DIAGNOSIS — M48061 Spinal stenosis, lumbar region without neurogenic claudication: Secondary | ICD-10-CM | POA: Diagnosis not present

## 2017-04-14 DIAGNOSIS — M47896 Other spondylosis, lumbar region: Secondary | ICD-10-CM | POA: Diagnosis not present

## 2017-04-14 DIAGNOSIS — F419 Anxiety disorder, unspecified: Secondary | ICD-10-CM | POA: Diagnosis not present

## 2017-04-14 DIAGNOSIS — M48062 Spinal stenosis, lumbar region with neurogenic claudication: Secondary | ICD-10-CM | POA: Diagnosis present

## 2017-04-14 DIAGNOSIS — E039 Hypothyroidism, unspecified: Secondary | ICD-10-CM | POA: Diagnosis not present

## 2017-04-14 DIAGNOSIS — I480 Paroxysmal atrial fibrillation: Secondary | ICD-10-CM | POA: Diagnosis not present

## 2017-04-14 DIAGNOSIS — G4733 Obstructive sleep apnea (adult) (pediatric): Secondary | ICD-10-CM | POA: Diagnosis not present

## 2017-04-14 DIAGNOSIS — Z79899 Other long term (current) drug therapy: Secondary | ICD-10-CM | POA: Diagnosis not present

## 2017-04-14 DIAGNOSIS — M109 Gout, unspecified: Secondary | ICD-10-CM | POA: Diagnosis not present

## 2017-04-14 DIAGNOSIS — J449 Chronic obstructive pulmonary disease, unspecified: Secondary | ICD-10-CM | POA: Diagnosis not present

## 2017-04-14 DIAGNOSIS — R0902 Hypoxemia: Secondary | ICD-10-CM | POA: Diagnosis not present

## 2017-04-14 DIAGNOSIS — M6281 Muscle weakness (generalized): Secondary | ICD-10-CM | POA: Diagnosis not present

## 2017-04-14 DIAGNOSIS — Z4789 Encounter for other orthopedic aftercare: Secondary | ICD-10-CM | POA: Diagnosis not present

## 2017-04-14 DIAGNOSIS — Z809 Family history of malignant neoplasm, unspecified: Secondary | ICD-10-CM | POA: Diagnosis not present

## 2017-04-14 DIAGNOSIS — Z833 Family history of diabetes mellitus: Secondary | ICD-10-CM | POA: Diagnosis not present

## 2017-04-14 DIAGNOSIS — F329 Major depressive disorder, single episode, unspecified: Secondary | ICD-10-CM | POA: Diagnosis not present

## 2017-04-14 DIAGNOSIS — Z856 Personal history of leukemia: Secondary | ICD-10-CM | POA: Diagnosis not present

## 2017-04-14 DIAGNOSIS — Z7901 Long term (current) use of anticoagulants: Secondary | ICD-10-CM | POA: Diagnosis not present

## 2017-04-14 DIAGNOSIS — M4807 Spinal stenosis, lumbosacral region: Secondary | ICD-10-CM | POA: Diagnosis not present

## 2017-04-14 DIAGNOSIS — K219 Gastro-esophageal reflux disease without esophagitis: Secondary | ICD-10-CM | POA: Diagnosis not present

## 2017-04-14 DIAGNOSIS — M4316 Spondylolisthesis, lumbar region: Secondary | ICD-10-CM | POA: Diagnosis not present

## 2017-04-14 DIAGNOSIS — I10 Essential (primary) hypertension: Secondary | ICD-10-CM | POA: Diagnosis not present

## 2017-04-21 DIAGNOSIS — M1990 Unspecified osteoarthritis, unspecified site: Secondary | ICD-10-CM | POA: Diagnosis not present

## 2017-04-21 DIAGNOSIS — M4807 Spinal stenosis, lumbosacral region: Secondary | ICD-10-CM | POA: Diagnosis not present

## 2017-04-21 DIAGNOSIS — N183 Chronic kidney disease, stage 3 (moderate): Secondary | ICD-10-CM | POA: Diagnosis not present

## 2017-04-21 DIAGNOSIS — M25512 Pain in left shoulder: Secondary | ICD-10-CM | POA: Diagnosis not present

## 2017-04-21 DIAGNOSIS — Z5181 Encounter for therapeutic drug level monitoring: Secondary | ICD-10-CM | POA: Diagnosis not present

## 2017-04-21 DIAGNOSIS — F329 Major depressive disorder, single episode, unspecified: Secondary | ICD-10-CM | POA: Diagnosis not present

## 2017-04-21 DIAGNOSIS — C9210 Chronic myeloid leukemia, BCR/ABL-positive, not having achieved remission: Secondary | ICD-10-CM | POA: Diagnosis not present

## 2017-04-21 DIAGNOSIS — G629 Polyneuropathy, unspecified: Secondary | ICD-10-CM | POA: Diagnosis not present

## 2017-04-21 DIAGNOSIS — M1991 Primary osteoarthritis, unspecified site: Secondary | ICD-10-CM | POA: Diagnosis not present

## 2017-04-21 DIAGNOSIS — M6281 Muscle weakness (generalized): Secondary | ICD-10-CM | POA: Diagnosis not present

## 2017-04-21 DIAGNOSIS — Z4789 Encounter for other orthopedic aftercare: Secondary | ICD-10-CM | POA: Diagnosis not present

## 2017-04-21 DIAGNOSIS — Z7901 Long term (current) use of anticoagulants: Secondary | ICD-10-CM | POA: Diagnosis not present

## 2017-04-21 DIAGNOSIS — I4891 Unspecified atrial fibrillation: Secondary | ICD-10-CM | POA: Diagnosis not present

## 2017-04-21 DIAGNOSIS — G4730 Sleep apnea, unspecified: Secondary | ICD-10-CM | POA: Diagnosis not present

## 2017-04-21 DIAGNOSIS — J449 Chronic obstructive pulmonary disease, unspecified: Secondary | ICD-10-CM | POA: Diagnosis not present

## 2017-04-21 DIAGNOSIS — M25511 Pain in right shoulder: Secondary | ICD-10-CM | POA: Diagnosis not present

## 2017-04-21 DIAGNOSIS — I10 Essential (primary) hypertension: Secondary | ICD-10-CM | POA: Diagnosis not present

## 2017-04-21 DIAGNOSIS — E039 Hypothyroidism, unspecified: Secondary | ICD-10-CM | POA: Diagnosis not present

## 2017-04-21 DIAGNOSIS — Z9981 Dependence on supplemental oxygen: Secondary | ICD-10-CM | POA: Diagnosis not present

## 2017-04-21 DIAGNOSIS — Z856 Personal history of leukemia: Secondary | ICD-10-CM | POA: Diagnosis not present

## 2017-04-21 DIAGNOSIS — R2689 Other abnormalities of gait and mobility: Secondary | ICD-10-CM | POA: Diagnosis not present

## 2017-04-21 DIAGNOSIS — Z981 Arthrodesis status: Secondary | ICD-10-CM | POA: Diagnosis not present

## 2017-04-21 DIAGNOSIS — I129 Hypertensive chronic kidney disease with stage 1 through stage 4 chronic kidney disease, or unspecified chronic kidney disease: Secondary | ICD-10-CM | POA: Diagnosis not present

## 2017-04-21 DIAGNOSIS — I480 Paroxysmal atrial fibrillation: Secondary | ICD-10-CM | POA: Diagnosis not present

## 2017-04-21 DIAGNOSIS — M109 Gout, unspecified: Secondary | ICD-10-CM | POA: Diagnosis not present

## 2017-04-21 DIAGNOSIS — K219 Gastro-esophageal reflux disease without esophagitis: Secondary | ICD-10-CM | POA: Diagnosis not present

## 2017-04-21 DIAGNOSIS — F419 Anxiety disorder, unspecified: Secondary | ICD-10-CM | POA: Diagnosis not present

## 2017-04-21 DIAGNOSIS — F418 Other specified anxiety disorders: Secondary | ICD-10-CM | POA: Diagnosis not present

## 2017-04-21 DIAGNOSIS — C9310 Chronic myelomonocytic leukemia not having achieved remission: Secondary | ICD-10-CM | POA: Diagnosis not present

## 2017-05-01 ENCOUNTER — Telehealth: Payer: Self-pay | Admitting: *Deleted

## 2017-05-01 DIAGNOSIS — C9310 Chronic myelomonocytic leukemia not having achieved remission: Secondary | ICD-10-CM | POA: Diagnosis not present

## 2017-05-01 DIAGNOSIS — G629 Polyneuropathy, unspecified: Secondary | ICD-10-CM | POA: Diagnosis not present

## 2017-05-01 DIAGNOSIS — I129 Hypertensive chronic kidney disease with stage 1 through stage 4 chronic kidney disease, or unspecified chronic kidney disease: Secondary | ICD-10-CM | POA: Diagnosis not present

## 2017-05-01 DIAGNOSIS — Z4789 Encounter for other orthopedic aftercare: Secondary | ICD-10-CM | POA: Diagnosis not present

## 2017-05-01 DIAGNOSIS — I4891 Unspecified atrial fibrillation: Secondary | ICD-10-CM | POA: Diagnosis not present

## 2017-05-01 DIAGNOSIS — N183 Chronic kidney disease, stage 3 (moderate): Secondary | ICD-10-CM | POA: Diagnosis not present

## 2017-05-01 NOTE — Telephone Encounter (Signed)
Pt has been cleared by Dr Maia Plan to restart coumadin.  She has been off coumadin since mid March for back surgery.  D/C from [facility] today to home with [facility] PT and RN to follow.  Agree pt can restart coumadin tonight.  She will take 5mg  tonight and tomorrow night then restart 2.5mg  daily except 5mg  on Mondays, Wednesdays and Fridays.  She will come to the office 4/9 for INR check unless [facility] RN checks it on Monday.

## 2017-05-01 NOTE — Telephone Encounter (Signed)
Stated that patient has been off of blood thinners for a while and he would like for her to be checked today before started medication back.

## 2017-05-01 NOTE — Telephone Encounter (Signed)
Mrs. Sabree was discharged from [facility] 05/01/2017. Family would like to speak with Edrick Oh, RN in regards to her coumdin schedule.

## 2017-05-05 DIAGNOSIS — I4891 Unspecified atrial fibrillation: Secondary | ICD-10-CM | POA: Diagnosis not present

## 2017-05-06 DIAGNOSIS — I4891 Unspecified atrial fibrillation: Secondary | ICD-10-CM | POA: Diagnosis not present

## 2017-05-06 DIAGNOSIS — Z4789 Encounter for other orthopedic aftercare: Secondary | ICD-10-CM | POA: Diagnosis not present

## 2017-05-06 DIAGNOSIS — G629 Polyneuropathy, unspecified: Secondary | ICD-10-CM | POA: Diagnosis not present

## 2017-05-06 DIAGNOSIS — N183 Chronic kidney disease, stage 3 (moderate): Secondary | ICD-10-CM | POA: Diagnosis not present

## 2017-05-06 DIAGNOSIS — C9310 Chronic myelomonocytic leukemia not having achieved remission: Secondary | ICD-10-CM | POA: Diagnosis not present

## 2017-05-06 DIAGNOSIS — I129 Hypertensive chronic kidney disease with stage 1 through stage 4 chronic kidney disease, or unspecified chronic kidney disease: Secondary | ICD-10-CM | POA: Diagnosis not present

## 2017-05-07 DIAGNOSIS — Z4789 Encounter for other orthopedic aftercare: Secondary | ICD-10-CM | POA: Diagnosis not present

## 2017-05-07 DIAGNOSIS — I129 Hypertensive chronic kidney disease with stage 1 through stage 4 chronic kidney disease, or unspecified chronic kidney disease: Secondary | ICD-10-CM | POA: Diagnosis not present

## 2017-05-07 DIAGNOSIS — G629 Polyneuropathy, unspecified: Secondary | ICD-10-CM | POA: Diagnosis not present

## 2017-05-07 DIAGNOSIS — I4891 Unspecified atrial fibrillation: Secondary | ICD-10-CM | POA: Diagnosis not present

## 2017-05-07 DIAGNOSIS — N183 Chronic kidney disease, stage 3 (moderate): Secondary | ICD-10-CM | POA: Diagnosis not present

## 2017-05-07 DIAGNOSIS — C9310 Chronic myelomonocytic leukemia not having achieved remission: Secondary | ICD-10-CM | POA: Diagnosis not present

## 2017-05-08 ENCOUNTER — Telehealth: Payer: Self-pay | Admitting: *Deleted

## 2017-05-08 NOTE — Telephone Encounter (Signed)
Called for order to check INR since she is seeing pt in the home.  Order given to check INR tomorrow (Fri) and call results to the office.  INR was not checked on 4/9 as previously discussed with son on 05/01/17.  Coumadin was restarted on 05/01/17 after d/c from [facility]. She took 5mg  x 2 days then started 2.5mg  daily except 5mg  on M,W,F.

## 2017-05-09 ENCOUNTER — Ambulatory Visit (INDEPENDENT_AMBULATORY_CARE_PROVIDER_SITE_OTHER): Payer: Medicare Other | Admitting: Cardiology

## 2017-05-09 ENCOUNTER — Telehealth: Payer: Self-pay | Admitting: *Deleted

## 2017-05-09 DIAGNOSIS — Z5181 Encounter for therapeutic drug level monitoring: Secondary | ICD-10-CM | POA: Diagnosis not present

## 2017-05-09 DIAGNOSIS — Z4789 Encounter for other orthopedic aftercare: Secondary | ICD-10-CM | POA: Diagnosis not present

## 2017-05-09 DIAGNOSIS — I4891 Unspecified atrial fibrillation: Secondary | ICD-10-CM | POA: Diagnosis not present

## 2017-05-09 DIAGNOSIS — G629 Polyneuropathy, unspecified: Secondary | ICD-10-CM | POA: Diagnosis not present

## 2017-05-09 DIAGNOSIS — I129 Hypertensive chronic kidney disease with stage 1 through stage 4 chronic kidney disease, or unspecified chronic kidney disease: Secondary | ICD-10-CM | POA: Diagnosis not present

## 2017-05-09 DIAGNOSIS — N183 Chronic kidney disease, stage 3 (moderate): Secondary | ICD-10-CM | POA: Diagnosis not present

## 2017-05-09 DIAGNOSIS — C9310 Chronic myelomonocytic leukemia not having achieved remission: Secondary | ICD-10-CM | POA: Diagnosis not present

## 2017-05-09 LAB — POCT INR: INR: 1.7

## 2017-05-09 NOTE — Telephone Encounter (Signed)
Was told by Lattie Haw to call today to give INR result. Current dose of coumadin is 5 mg on M-W-F all other days 2.5 mg.

## 2017-05-09 NOTE — Patient Instructions (Signed)
Description   Spoke with pt's son and instructed to have pt take 7.5mg  today April 12th then continue coumadin 1/2 tablet daily except 1 tablet on Mondays, Wednesdays and Fridays Recheck in 1 week per [place] given to [place] with [facility] and she states understanding

## 2017-05-09 NOTE — Telephone Encounter (Signed)
See coumadin encounter of this date 

## 2017-05-12 DIAGNOSIS — N183 Chronic kidney disease, stage 3 (moderate): Secondary | ICD-10-CM | POA: Diagnosis not present

## 2017-05-12 DIAGNOSIS — I4891 Unspecified atrial fibrillation: Secondary | ICD-10-CM | POA: Diagnosis not present

## 2017-05-12 DIAGNOSIS — G629 Polyneuropathy, unspecified: Secondary | ICD-10-CM | POA: Diagnosis not present

## 2017-05-12 DIAGNOSIS — I129 Hypertensive chronic kidney disease with stage 1 through stage 4 chronic kidney disease, or unspecified chronic kidney disease: Secondary | ICD-10-CM | POA: Diagnosis not present

## 2017-05-12 DIAGNOSIS — C9310 Chronic myelomonocytic leukemia not having achieved remission: Secondary | ICD-10-CM | POA: Diagnosis not present

## 2017-05-12 DIAGNOSIS — Z4789 Encounter for other orthopedic aftercare: Secondary | ICD-10-CM | POA: Diagnosis not present

## 2017-05-13 DIAGNOSIS — I4891 Unspecified atrial fibrillation: Secondary | ICD-10-CM | POA: Diagnosis not present

## 2017-05-13 DIAGNOSIS — N183 Chronic kidney disease, stage 3 (moderate): Secondary | ICD-10-CM | POA: Diagnosis not present

## 2017-05-13 DIAGNOSIS — Z4789 Encounter for other orthopedic aftercare: Secondary | ICD-10-CM | POA: Diagnosis not present

## 2017-05-13 DIAGNOSIS — C9310 Chronic myelomonocytic leukemia not having achieved remission: Secondary | ICD-10-CM | POA: Diagnosis not present

## 2017-05-13 DIAGNOSIS — G629 Polyneuropathy, unspecified: Secondary | ICD-10-CM | POA: Diagnosis not present

## 2017-05-13 DIAGNOSIS — I129 Hypertensive chronic kidney disease with stage 1 through stage 4 chronic kidney disease, or unspecified chronic kidney disease: Secondary | ICD-10-CM | POA: Diagnosis not present

## 2017-05-14 DIAGNOSIS — Z4789 Encounter for other orthopedic aftercare: Secondary | ICD-10-CM | POA: Diagnosis not present

## 2017-05-14 DIAGNOSIS — C9310 Chronic myelomonocytic leukemia not having achieved remission: Secondary | ICD-10-CM | POA: Diagnosis not present

## 2017-05-14 DIAGNOSIS — I4891 Unspecified atrial fibrillation: Secondary | ICD-10-CM | POA: Diagnosis not present

## 2017-05-14 DIAGNOSIS — N183 Chronic kidney disease, stage 3 (moderate): Secondary | ICD-10-CM | POA: Diagnosis not present

## 2017-05-14 DIAGNOSIS — G629 Polyneuropathy, unspecified: Secondary | ICD-10-CM | POA: Diagnosis not present

## 2017-05-14 DIAGNOSIS — I129 Hypertensive chronic kidney disease with stage 1 through stage 4 chronic kidney disease, or unspecified chronic kidney disease: Secondary | ICD-10-CM | POA: Diagnosis not present

## 2017-05-15 DIAGNOSIS — Z4789 Encounter for other orthopedic aftercare: Secondary | ICD-10-CM | POA: Diagnosis not present

## 2017-05-15 DIAGNOSIS — G629 Polyneuropathy, unspecified: Secondary | ICD-10-CM | POA: Diagnosis not present

## 2017-05-15 DIAGNOSIS — N183 Chronic kidney disease, stage 3 (moderate): Secondary | ICD-10-CM | POA: Diagnosis not present

## 2017-05-15 DIAGNOSIS — C9310 Chronic myelomonocytic leukemia not having achieved remission: Secondary | ICD-10-CM | POA: Diagnosis not present

## 2017-05-15 DIAGNOSIS — I129 Hypertensive chronic kidney disease with stage 1 through stage 4 chronic kidney disease, or unspecified chronic kidney disease: Secondary | ICD-10-CM | POA: Diagnosis not present

## 2017-05-15 DIAGNOSIS — I4891 Unspecified atrial fibrillation: Secondary | ICD-10-CM | POA: Diagnosis not present

## 2017-05-16 DIAGNOSIS — C9310 Chronic myelomonocytic leukemia not having achieved remission: Secondary | ICD-10-CM | POA: Diagnosis not present

## 2017-05-16 DIAGNOSIS — N183 Chronic kidney disease, stage 3 (moderate): Secondary | ICD-10-CM | POA: Diagnosis not present

## 2017-05-16 DIAGNOSIS — I4891 Unspecified atrial fibrillation: Secondary | ICD-10-CM | POA: Diagnosis not present

## 2017-05-16 DIAGNOSIS — I129 Hypertensive chronic kidney disease with stage 1 through stage 4 chronic kidney disease, or unspecified chronic kidney disease: Secondary | ICD-10-CM | POA: Diagnosis not present

## 2017-05-16 DIAGNOSIS — G629 Polyneuropathy, unspecified: Secondary | ICD-10-CM | POA: Diagnosis not present

## 2017-05-16 DIAGNOSIS — Z4789 Encounter for other orthopedic aftercare: Secondary | ICD-10-CM | POA: Diagnosis not present

## 2017-05-19 DIAGNOSIS — G629 Polyneuropathy, unspecified: Secondary | ICD-10-CM | POA: Diagnosis not present

## 2017-05-19 DIAGNOSIS — I4891 Unspecified atrial fibrillation: Secondary | ICD-10-CM | POA: Diagnosis not present

## 2017-05-19 DIAGNOSIS — N183 Chronic kidney disease, stage 3 (moderate): Secondary | ICD-10-CM | POA: Diagnosis not present

## 2017-05-19 DIAGNOSIS — I129 Hypertensive chronic kidney disease with stage 1 through stage 4 chronic kidney disease, or unspecified chronic kidney disease: Secondary | ICD-10-CM | POA: Diagnosis not present

## 2017-05-19 DIAGNOSIS — C9310 Chronic myelomonocytic leukemia not having achieved remission: Secondary | ICD-10-CM | POA: Diagnosis not present

## 2017-05-19 DIAGNOSIS — Z4789 Encounter for other orthopedic aftercare: Secondary | ICD-10-CM | POA: Diagnosis not present

## 2017-05-20 DIAGNOSIS — I129 Hypertensive chronic kidney disease with stage 1 through stage 4 chronic kidney disease, or unspecified chronic kidney disease: Secondary | ICD-10-CM | POA: Diagnosis not present

## 2017-05-20 DIAGNOSIS — G629 Polyneuropathy, unspecified: Secondary | ICD-10-CM | POA: Diagnosis not present

## 2017-05-20 DIAGNOSIS — C9310 Chronic myelomonocytic leukemia not having achieved remission: Secondary | ICD-10-CM | POA: Diagnosis not present

## 2017-05-20 DIAGNOSIS — N183 Chronic kidney disease, stage 3 (moderate): Secondary | ICD-10-CM | POA: Diagnosis not present

## 2017-05-20 DIAGNOSIS — Z4789 Encounter for other orthopedic aftercare: Secondary | ICD-10-CM | POA: Diagnosis not present

## 2017-05-20 DIAGNOSIS — I4891 Unspecified atrial fibrillation: Secondary | ICD-10-CM | POA: Diagnosis not present

## 2017-05-26 DIAGNOSIS — C9310 Chronic myelomonocytic leukemia not having achieved remission: Secondary | ICD-10-CM | POA: Diagnosis not present

## 2017-05-26 DIAGNOSIS — K140 Glossitis: Secondary | ICD-10-CM | POA: Diagnosis not present

## 2017-05-26 DIAGNOSIS — M79604 Pain in right leg: Secondary | ICD-10-CM | POA: Diagnosis not present

## 2017-05-26 DIAGNOSIS — K1379 Other lesions of oral mucosa: Secondary | ICD-10-CM | POA: Diagnosis not present

## 2017-05-28 DIAGNOSIS — M4856XD Collapsed vertebra, not elsewhere classified, lumbar region, subsequent encounter for fracture with routine healing: Secondary | ICD-10-CM | POA: Diagnosis not present

## 2017-05-28 DIAGNOSIS — Z981 Arthrodesis status: Secondary | ICD-10-CM | POA: Diagnosis not present

## 2017-05-28 DIAGNOSIS — S32010A Wedge compression fracture of first lumbar vertebra, initial encounter for closed fracture: Secondary | ICD-10-CM | POA: Diagnosis not present

## 2017-05-28 DIAGNOSIS — M4316 Spondylolisthesis, lumbar region: Secondary | ICD-10-CM | POA: Diagnosis not present

## 2017-06-02 DIAGNOSIS — I4891 Unspecified atrial fibrillation: Secondary | ICD-10-CM | POA: Diagnosis not present

## 2017-06-02 DIAGNOSIS — C9310 Chronic myelomonocytic leukemia not having achieved remission: Secondary | ICD-10-CM | POA: Diagnosis not present

## 2017-06-02 DIAGNOSIS — N183 Chronic kidney disease, stage 3 (moderate): Secondary | ICD-10-CM | POA: Diagnosis not present

## 2017-06-02 DIAGNOSIS — G629 Polyneuropathy, unspecified: Secondary | ICD-10-CM | POA: Diagnosis not present

## 2017-06-02 DIAGNOSIS — Z4789 Encounter for other orthopedic aftercare: Secondary | ICD-10-CM | POA: Diagnosis not present

## 2017-06-02 DIAGNOSIS — I129 Hypertensive chronic kidney disease with stage 1 through stage 4 chronic kidney disease, or unspecified chronic kidney disease: Secondary | ICD-10-CM | POA: Diagnosis not present

## 2017-06-04 DIAGNOSIS — C9310 Chronic myelomonocytic leukemia not having achieved remission: Secondary | ICD-10-CM | POA: Diagnosis not present

## 2017-06-04 DIAGNOSIS — I4891 Unspecified atrial fibrillation: Secondary | ICD-10-CM | POA: Diagnosis not present

## 2017-06-04 DIAGNOSIS — Z4789 Encounter for other orthopedic aftercare: Secondary | ICD-10-CM | POA: Diagnosis not present

## 2017-06-04 DIAGNOSIS — N183 Chronic kidney disease, stage 3 (moderate): Secondary | ICD-10-CM | POA: Diagnosis not present

## 2017-06-04 DIAGNOSIS — G629 Polyneuropathy, unspecified: Secondary | ICD-10-CM | POA: Diagnosis not present

## 2017-06-04 DIAGNOSIS — I129 Hypertensive chronic kidney disease with stage 1 through stage 4 chronic kidney disease, or unspecified chronic kidney disease: Secondary | ICD-10-CM | POA: Diagnosis not present

## 2017-06-09 DIAGNOSIS — N183 Chronic kidney disease, stage 3 (moderate): Secondary | ICD-10-CM | POA: Diagnosis not present

## 2017-06-09 DIAGNOSIS — I129 Hypertensive chronic kidney disease with stage 1 through stage 4 chronic kidney disease, or unspecified chronic kidney disease: Secondary | ICD-10-CM | POA: Diagnosis not present

## 2017-06-09 DIAGNOSIS — G629 Polyneuropathy, unspecified: Secondary | ICD-10-CM | POA: Diagnosis not present

## 2017-06-09 DIAGNOSIS — I4891 Unspecified atrial fibrillation: Secondary | ICD-10-CM | POA: Diagnosis not present

## 2017-06-09 DIAGNOSIS — C9310 Chronic myelomonocytic leukemia not having achieved remission: Secondary | ICD-10-CM | POA: Diagnosis not present

## 2017-06-09 DIAGNOSIS — Z4789 Encounter for other orthopedic aftercare: Secondary | ICD-10-CM | POA: Diagnosis not present

## 2017-06-10 ENCOUNTER — Ambulatory Visit (INDEPENDENT_AMBULATORY_CARE_PROVIDER_SITE_OTHER): Payer: Medicare Other | Admitting: *Deleted

## 2017-06-10 DIAGNOSIS — Z5181 Encounter for therapeutic drug level monitoring: Secondary | ICD-10-CM

## 2017-06-10 DIAGNOSIS — I4891 Unspecified atrial fibrillation: Secondary | ICD-10-CM | POA: Diagnosis not present

## 2017-06-10 LAB — POCT INR: INR: 7.6

## 2017-06-10 NOTE — Patient Instructions (Signed)
Hold coumadin tonight and tomorrow night and recheck INR on 06/12/17 Bleeding and fall precautions discussed with pt/spouse

## 2017-06-12 ENCOUNTER — Ambulatory Visit (INDEPENDENT_AMBULATORY_CARE_PROVIDER_SITE_OTHER): Payer: Medicare Other | Admitting: *Deleted

## 2017-06-12 DIAGNOSIS — Z5181 Encounter for therapeutic drug level monitoring: Secondary | ICD-10-CM

## 2017-06-12 DIAGNOSIS — I4891 Unspecified atrial fibrillation: Secondary | ICD-10-CM | POA: Diagnosis not present

## 2017-06-12 LAB — POCT INR: INR: 3.9

## 2017-06-12 NOTE — Patient Instructions (Signed)
Hold coumadin tonight then resume 1/2 tablet daily except 1 tablet on Mondays, Wednesdays and Fridays and recheck INR on 06/24/17

## 2017-06-17 DIAGNOSIS — R5383 Other fatigue: Secondary | ICD-10-CM | POA: Diagnosis not present

## 2017-06-17 DIAGNOSIS — I4891 Unspecified atrial fibrillation: Secondary | ICD-10-CM | POA: Diagnosis not present

## 2017-06-17 DIAGNOSIS — E2839 Other primary ovarian failure: Secondary | ICD-10-CM | POA: Diagnosis not present

## 2017-06-17 DIAGNOSIS — I10 Essential (primary) hypertension: Secondary | ICD-10-CM | POA: Diagnosis not present

## 2017-06-20 DIAGNOSIS — I129 Hypertensive chronic kidney disease with stage 1 through stage 4 chronic kidney disease, or unspecified chronic kidney disease: Secondary | ICD-10-CM | POA: Diagnosis not present

## 2017-06-20 DIAGNOSIS — G629 Polyneuropathy, unspecified: Secondary | ICD-10-CM | POA: Diagnosis not present

## 2017-06-20 DIAGNOSIS — N183 Chronic kidney disease, stage 3 (moderate): Secondary | ICD-10-CM | POA: Diagnosis not present

## 2017-06-20 DIAGNOSIS — C9310 Chronic myelomonocytic leukemia not having achieved remission: Secondary | ICD-10-CM | POA: Diagnosis not present

## 2017-06-20 DIAGNOSIS — Z4789 Encounter for other orthopedic aftercare: Secondary | ICD-10-CM | POA: Diagnosis not present

## 2017-06-20 DIAGNOSIS — I4891 Unspecified atrial fibrillation: Secondary | ICD-10-CM | POA: Diagnosis not present

## 2017-06-24 ENCOUNTER — Ambulatory Visit (INDEPENDENT_AMBULATORY_CARE_PROVIDER_SITE_OTHER): Payer: Medicare Other | Admitting: *Deleted

## 2017-06-24 DIAGNOSIS — I4891 Unspecified atrial fibrillation: Secondary | ICD-10-CM

## 2017-06-24 DIAGNOSIS — Z5181 Encounter for therapeutic drug level monitoring: Secondary | ICD-10-CM

## 2017-06-24 LAB — POCT INR: INR: 2.9 (ref 2.0–3.0)

## 2017-06-24 NOTE — Patient Instructions (Signed)
Continue coumadin 1/2 tablet daily except 1 tablet on Mondays, Wednesdays and Fridays and recheck INR on 07/15/17

## 2017-06-27 DIAGNOSIS — N183 Chronic kidney disease, stage 3 (moderate): Secondary | ICD-10-CM | POA: Diagnosis not present

## 2017-06-27 DIAGNOSIS — G629 Polyneuropathy, unspecified: Secondary | ICD-10-CM | POA: Diagnosis not present

## 2017-06-27 DIAGNOSIS — I4891 Unspecified atrial fibrillation: Secondary | ICD-10-CM | POA: Diagnosis not present

## 2017-06-27 DIAGNOSIS — I129 Hypertensive chronic kidney disease with stage 1 through stage 4 chronic kidney disease, or unspecified chronic kidney disease: Secondary | ICD-10-CM | POA: Diagnosis not present

## 2017-06-27 DIAGNOSIS — Z4789 Encounter for other orthopedic aftercare: Secondary | ICD-10-CM | POA: Diagnosis not present

## 2017-06-27 DIAGNOSIS — C9310 Chronic myelomonocytic leukemia not having achieved remission: Secondary | ICD-10-CM | POA: Diagnosis not present

## 2017-06-30 DIAGNOSIS — M25512 Pain in left shoulder: Secondary | ICD-10-CM | POA: Diagnosis not present

## 2017-06-30 DIAGNOSIS — Z4789 Encounter for other orthopedic aftercare: Secondary | ICD-10-CM | POA: Diagnosis not present

## 2017-06-30 DIAGNOSIS — I4891 Unspecified atrial fibrillation: Secondary | ICD-10-CM | POA: Diagnosis not present

## 2017-06-30 DIAGNOSIS — I129 Hypertensive chronic kidney disease with stage 1 through stage 4 chronic kidney disease, or unspecified chronic kidney disease: Secondary | ICD-10-CM | POA: Diagnosis not present

## 2017-06-30 DIAGNOSIS — N183 Chronic kidney disease, stage 3 (moderate): Secondary | ICD-10-CM | POA: Diagnosis not present

## 2017-06-30 DIAGNOSIS — M25511 Pain in right shoulder: Secondary | ICD-10-CM | POA: Diagnosis not present

## 2017-07-09 DIAGNOSIS — Z4789 Encounter for other orthopedic aftercare: Secondary | ICD-10-CM | POA: Diagnosis not present

## 2017-07-09 DIAGNOSIS — M25512 Pain in left shoulder: Secondary | ICD-10-CM | POA: Diagnosis not present

## 2017-07-09 DIAGNOSIS — I129 Hypertensive chronic kidney disease with stage 1 through stage 4 chronic kidney disease, or unspecified chronic kidney disease: Secondary | ICD-10-CM | POA: Diagnosis not present

## 2017-07-09 DIAGNOSIS — M25511 Pain in right shoulder: Secondary | ICD-10-CM | POA: Diagnosis not present

## 2017-07-09 DIAGNOSIS — N183 Chronic kidney disease, stage 3 (moderate): Secondary | ICD-10-CM | POA: Diagnosis not present

## 2017-07-09 DIAGNOSIS — I4891 Unspecified atrial fibrillation: Secondary | ICD-10-CM | POA: Diagnosis not present

## 2017-07-11 DIAGNOSIS — Z4789 Encounter for other orthopedic aftercare: Secondary | ICD-10-CM | POA: Diagnosis not present

## 2017-07-11 DIAGNOSIS — M25511 Pain in right shoulder: Secondary | ICD-10-CM | POA: Diagnosis not present

## 2017-07-11 DIAGNOSIS — I129 Hypertensive chronic kidney disease with stage 1 through stage 4 chronic kidney disease, or unspecified chronic kidney disease: Secondary | ICD-10-CM | POA: Diagnosis not present

## 2017-07-11 DIAGNOSIS — M25512 Pain in left shoulder: Secondary | ICD-10-CM | POA: Diagnosis not present

## 2017-07-11 DIAGNOSIS — N183 Chronic kidney disease, stage 3 (moderate): Secondary | ICD-10-CM | POA: Diagnosis not present

## 2017-07-11 DIAGNOSIS — I4891 Unspecified atrial fibrillation: Secondary | ICD-10-CM | POA: Diagnosis not present

## 2017-07-15 ENCOUNTER — Ambulatory Visit (INDEPENDENT_AMBULATORY_CARE_PROVIDER_SITE_OTHER): Payer: Medicare Other | Admitting: *Deleted

## 2017-07-15 DIAGNOSIS — I4891 Unspecified atrial fibrillation: Secondary | ICD-10-CM

## 2017-07-15 DIAGNOSIS — Z5181 Encounter for therapeutic drug level monitoring: Secondary | ICD-10-CM

## 2017-07-15 LAB — POCT INR: INR: 3.9 — AB (ref 2.0–3.0)

## 2017-07-15 NOTE — Patient Instructions (Signed)
Hold coumadin tonight then resume 1/2 tablet daily except 1 tablet on Mondays, Wednesdays and Fridays and recheck INR on 08/05/17

## 2017-07-16 DIAGNOSIS — M25512 Pain in left shoulder: Secondary | ICD-10-CM | POA: Diagnosis not present

## 2017-07-16 DIAGNOSIS — Z4789 Encounter for other orthopedic aftercare: Secondary | ICD-10-CM | POA: Diagnosis not present

## 2017-07-16 DIAGNOSIS — N183 Chronic kidney disease, stage 3 (moderate): Secondary | ICD-10-CM | POA: Diagnosis not present

## 2017-07-16 DIAGNOSIS — I4891 Unspecified atrial fibrillation: Secondary | ICD-10-CM | POA: Diagnosis not present

## 2017-07-16 DIAGNOSIS — M25511 Pain in right shoulder: Secondary | ICD-10-CM | POA: Diagnosis not present

## 2017-07-16 DIAGNOSIS — I129 Hypertensive chronic kidney disease with stage 1 through stage 4 chronic kidney disease, or unspecified chronic kidney disease: Secondary | ICD-10-CM | POA: Diagnosis not present

## 2017-07-18 DIAGNOSIS — I4891 Unspecified atrial fibrillation: Secondary | ICD-10-CM | POA: Diagnosis not present

## 2017-07-18 DIAGNOSIS — M25511 Pain in right shoulder: Secondary | ICD-10-CM | POA: Diagnosis not present

## 2017-07-18 DIAGNOSIS — Z4789 Encounter for other orthopedic aftercare: Secondary | ICD-10-CM | POA: Diagnosis not present

## 2017-07-18 DIAGNOSIS — N183 Chronic kidney disease, stage 3 (moderate): Secondary | ICD-10-CM | POA: Diagnosis not present

## 2017-07-18 DIAGNOSIS — M25512 Pain in left shoulder: Secondary | ICD-10-CM | POA: Diagnosis not present

## 2017-07-18 DIAGNOSIS — I129 Hypertensive chronic kidney disease with stage 1 through stage 4 chronic kidney disease, or unspecified chronic kidney disease: Secondary | ICD-10-CM | POA: Diagnosis not present

## 2017-07-22 DIAGNOSIS — I4891 Unspecified atrial fibrillation: Secondary | ICD-10-CM | POA: Diagnosis not present

## 2017-07-22 DIAGNOSIS — Z4789 Encounter for other orthopedic aftercare: Secondary | ICD-10-CM | POA: Diagnosis not present

## 2017-07-22 DIAGNOSIS — M25512 Pain in left shoulder: Secondary | ICD-10-CM | POA: Diagnosis not present

## 2017-07-22 DIAGNOSIS — N183 Chronic kidney disease, stage 3 (moderate): Secondary | ICD-10-CM | POA: Diagnosis not present

## 2017-07-22 DIAGNOSIS — M25511 Pain in right shoulder: Secondary | ICD-10-CM | POA: Diagnosis not present

## 2017-07-22 DIAGNOSIS — I129 Hypertensive chronic kidney disease with stage 1 through stage 4 chronic kidney disease, or unspecified chronic kidney disease: Secondary | ICD-10-CM | POA: Diagnosis not present

## 2017-07-24 DIAGNOSIS — N183 Chronic kidney disease, stage 3 (moderate): Secondary | ICD-10-CM | POA: Diagnosis not present

## 2017-07-24 DIAGNOSIS — I4891 Unspecified atrial fibrillation: Secondary | ICD-10-CM | POA: Diagnosis not present

## 2017-07-24 DIAGNOSIS — M25512 Pain in left shoulder: Secondary | ICD-10-CM | POA: Diagnosis not present

## 2017-07-24 DIAGNOSIS — Z4789 Encounter for other orthopedic aftercare: Secondary | ICD-10-CM | POA: Diagnosis not present

## 2017-07-24 DIAGNOSIS — I129 Hypertensive chronic kidney disease with stage 1 through stage 4 chronic kidney disease, or unspecified chronic kidney disease: Secondary | ICD-10-CM | POA: Diagnosis not present

## 2017-07-24 DIAGNOSIS — M25511 Pain in right shoulder: Secondary | ICD-10-CM | POA: Diagnosis not present

## 2017-07-29 DIAGNOSIS — I129 Hypertensive chronic kidney disease with stage 1 through stage 4 chronic kidney disease, or unspecified chronic kidney disease: Secondary | ICD-10-CM | POA: Diagnosis not present

## 2017-07-29 DIAGNOSIS — M25512 Pain in left shoulder: Secondary | ICD-10-CM | POA: Diagnosis not present

## 2017-07-29 DIAGNOSIS — I4891 Unspecified atrial fibrillation: Secondary | ICD-10-CM | POA: Diagnosis not present

## 2017-07-29 DIAGNOSIS — M25511 Pain in right shoulder: Secondary | ICD-10-CM | POA: Diagnosis not present

## 2017-07-29 DIAGNOSIS — N183 Chronic kidney disease, stage 3 (moderate): Secondary | ICD-10-CM | POA: Diagnosis not present

## 2017-07-29 DIAGNOSIS — Z4789 Encounter for other orthopedic aftercare: Secondary | ICD-10-CM | POA: Diagnosis not present

## 2017-07-30 DIAGNOSIS — M4317 Spondylolisthesis, lumbosacral region: Secondary | ICD-10-CM | POA: Diagnosis not present

## 2017-07-30 DIAGNOSIS — M4807 Spinal stenosis, lumbosacral region: Secondary | ICD-10-CM | POA: Diagnosis not present

## 2017-07-30 DIAGNOSIS — M4716 Other spondylosis with myelopathy, lumbar region: Secondary | ICD-10-CM | POA: Diagnosis not present

## 2017-08-04 DIAGNOSIS — I4891 Unspecified atrial fibrillation: Secondary | ICD-10-CM | POA: Diagnosis not present

## 2017-08-04 DIAGNOSIS — M25511 Pain in right shoulder: Secondary | ICD-10-CM | POA: Diagnosis not present

## 2017-08-04 DIAGNOSIS — N183 Chronic kidney disease, stage 3 (moderate): Secondary | ICD-10-CM | POA: Diagnosis not present

## 2017-08-04 DIAGNOSIS — M25512 Pain in left shoulder: Secondary | ICD-10-CM | POA: Diagnosis not present

## 2017-08-04 DIAGNOSIS — Z4789 Encounter for other orthopedic aftercare: Secondary | ICD-10-CM | POA: Diagnosis not present

## 2017-08-04 DIAGNOSIS — I129 Hypertensive chronic kidney disease with stage 1 through stage 4 chronic kidney disease, or unspecified chronic kidney disease: Secondary | ICD-10-CM | POA: Diagnosis not present

## 2017-08-05 DIAGNOSIS — C9200 Acute myeloblastic leukemia, not having achieved remission: Secondary | ICD-10-CM | POA: Diagnosis not present

## 2017-08-05 DIAGNOSIS — C9310 Chronic myelomonocytic leukemia not having achieved remission: Secondary | ICD-10-CM | POA: Diagnosis not present

## 2017-08-05 DIAGNOSIS — R110 Nausea: Secondary | ICD-10-CM | POA: Diagnosis not present

## 2017-08-05 DIAGNOSIS — R161 Splenomegaly, not elsewhere classified: Secondary | ICD-10-CM | POA: Diagnosis not present

## 2017-08-07 DIAGNOSIS — R001 Bradycardia, unspecified: Secondary | ICD-10-CM | POA: Diagnosis not present

## 2017-08-07 DIAGNOSIS — D61818 Other pancytopenia: Secondary | ICD-10-CM | POA: Diagnosis not present

## 2017-08-07 DIAGNOSIS — D61810 Antineoplastic chemotherapy induced pancytopenia: Secondary | ICD-10-CM | POA: Diagnosis not present

## 2017-08-07 DIAGNOSIS — M25551 Pain in right hip: Secondary | ICD-10-CM | POA: Diagnosis not present

## 2017-08-07 DIAGNOSIS — I4891 Unspecified atrial fibrillation: Secondary | ICD-10-CM | POA: Diagnosis not present

## 2017-08-07 DIAGNOSIS — E8770 Fluid overload, unspecified: Secondary | ICD-10-CM | POA: Diagnosis not present

## 2017-08-07 DIAGNOSIS — N179 Acute kidney failure, unspecified: Secondary | ICD-10-CM | POA: Diagnosis not present

## 2017-08-07 DIAGNOSIS — I493 Ventricular premature depolarization: Secondary | ICD-10-CM | POA: Diagnosis not present

## 2017-08-07 DIAGNOSIS — I10 Essential (primary) hypertension: Secondary | ICD-10-CM | POA: Diagnosis not present

## 2017-08-07 DIAGNOSIS — R9431 Abnormal electrocardiogram [ECG] [EKG]: Secondary | ICD-10-CM | POA: Diagnosis not present

## 2017-08-07 DIAGNOSIS — R609 Edema, unspecified: Secondary | ICD-10-CM | POA: Diagnosis not present

## 2017-08-07 DIAGNOSIS — T451X5A Adverse effect of antineoplastic and immunosuppressive drugs, initial encounter: Secondary | ICD-10-CM | POA: Diagnosis not present

## 2017-08-07 DIAGNOSIS — E869 Volume depletion, unspecified: Secondary | ICD-10-CM | POA: Diagnosis not present

## 2017-08-07 DIAGNOSIS — D696 Thrombocytopenia, unspecified: Secondary | ICD-10-CM | POA: Diagnosis not present

## 2017-08-07 DIAGNOSIS — T80319A ABO incompatibility with hemolytic transfusion reaction, unspecified, initial encounter: Secondary | ICD-10-CM | POA: Diagnosis not present

## 2017-08-07 DIAGNOSIS — D649 Anemia, unspecified: Secondary | ICD-10-CM | POA: Diagnosis not present

## 2017-08-07 DIAGNOSIS — E883 Tumor lysis syndrome: Secondary | ICD-10-CM | POA: Diagnosis not present

## 2017-08-07 DIAGNOSIS — Z856 Personal history of leukemia: Secondary | ICD-10-CM | POA: Diagnosis not present

## 2017-08-07 DIAGNOSIS — I4892 Unspecified atrial flutter: Secondary | ICD-10-CM | POA: Diagnosis not present

## 2017-08-07 DIAGNOSIS — C92A0 Acute myeloid leukemia with multilineage dysplasia, not having achieved remission: Secondary | ICD-10-CM | POA: Diagnosis not present

## 2017-08-07 DIAGNOSIS — K219 Gastro-esophageal reflux disease without esophagitis: Secondary | ICD-10-CM | POA: Diagnosis not present

## 2017-08-07 DIAGNOSIS — R161 Splenomegaly, not elsewhere classified: Secondary | ICD-10-CM | POA: Diagnosis not present

## 2017-08-07 DIAGNOSIS — Z7901 Long term (current) use of anticoagulants: Secondary | ICD-10-CM | POA: Diagnosis not present

## 2017-08-07 DIAGNOSIS — C9201 Acute myeloblastic leukemia, in remission: Secondary | ICD-10-CM | POA: Diagnosis not present

## 2017-08-07 DIAGNOSIS — Z5111 Encounter for antineoplastic chemotherapy: Secondary | ICD-10-CM | POA: Diagnosis not present

## 2017-08-07 DIAGNOSIS — D72829 Elevated white blood cell count, unspecified: Secondary | ICD-10-CM | POA: Diagnosis not present

## 2017-08-07 DIAGNOSIS — E876 Hypokalemia: Secondary | ICD-10-CM | POA: Diagnosis not present

## 2017-08-07 DIAGNOSIS — I129 Hypertensive chronic kidney disease with stage 1 through stage 4 chronic kidney disease, or unspecified chronic kidney disease: Secondary | ICD-10-CM | POA: Diagnosis not present

## 2017-08-07 DIAGNOSIS — Z9981 Dependence on supplemental oxygen: Secondary | ICD-10-CM | POA: Diagnosis not present

## 2017-08-07 DIAGNOSIS — M25552 Pain in left hip: Secondary | ICD-10-CM | POA: Diagnosis not present

## 2017-08-07 DIAGNOSIS — R0602 Shortness of breath: Secondary | ICD-10-CM | POA: Diagnosis not present

## 2017-08-07 DIAGNOSIS — F418 Other specified anxiety disorders: Secondary | ICD-10-CM | POA: Diagnosis not present

## 2017-08-07 DIAGNOSIS — Z95828 Presence of other vascular implants and grafts: Secondary | ICD-10-CM | POA: Diagnosis not present

## 2017-08-07 DIAGNOSIS — E875 Hyperkalemia: Secondary | ICD-10-CM | POA: Diagnosis not present

## 2017-08-07 DIAGNOSIS — E871 Hypo-osmolality and hyponatremia: Secondary | ICD-10-CM | POA: Diagnosis not present

## 2017-08-07 DIAGNOSIS — N183 Chronic kidney disease, stage 3 (moderate): Secondary | ICD-10-CM | POA: Diagnosis present

## 2017-08-07 DIAGNOSIS — I499 Cardiac arrhythmia, unspecified: Secondary | ICD-10-CM | POA: Diagnosis not present

## 2017-08-07 DIAGNOSIS — R197 Diarrhea, unspecified: Secondary | ICD-10-CM | POA: Diagnosis not present

## 2017-08-07 DIAGNOSIS — R21 Rash and other nonspecific skin eruption: Secondary | ICD-10-CM | POA: Diagnosis not present

## 2017-08-07 DIAGNOSIS — N189 Chronic kidney disease, unspecified: Secondary | ICD-10-CM | POA: Diagnosis not present

## 2017-08-07 DIAGNOSIS — G4733 Obstructive sleep apnea (adult) (pediatric): Secondary | ICD-10-CM | POA: Diagnosis present

## 2017-08-07 DIAGNOSIS — I491 Atrial premature depolarization: Secondary | ICD-10-CM | POA: Diagnosis not present

## 2017-08-07 DIAGNOSIS — C9200 Acute myeloblastic leukemia, not having achieved remission: Secondary | ICD-10-CM | POA: Diagnosis not present

## 2017-08-07 DIAGNOSIS — G8929 Other chronic pain: Secondary | ICD-10-CM | POA: Diagnosis present

## 2017-08-07 DIAGNOSIS — D469 Myelodysplastic syndrome, unspecified: Secondary | ICD-10-CM | POA: Diagnosis not present

## 2017-08-07 DIAGNOSIS — R791 Abnormal coagulation profile: Secondary | ICD-10-CM | POA: Diagnosis not present

## 2017-08-07 DIAGNOSIS — C9500 Acute leukemia of unspecified cell type not having achieved remission: Secondary | ICD-10-CM | POA: Diagnosis not present

## 2017-08-07 DIAGNOSIS — C9310 Chronic myelomonocytic leukemia not having achieved remission: Secondary | ICD-10-CM | POA: Diagnosis not present

## 2017-08-07 DIAGNOSIS — R5381 Other malaise: Secondary | ICD-10-CM | POA: Diagnosis not present

## 2017-08-07 DIAGNOSIS — R600 Localized edema: Secondary | ICD-10-CM | POA: Diagnosis not present

## 2017-08-07 DIAGNOSIS — N170 Acute kidney failure with tubular necrosis: Secondary | ICD-10-CM | POA: Diagnosis present

## 2017-08-29 DIAGNOSIS — C9200 Acute myeloblastic leukemia, not having achieved remission: Secondary | ICD-10-CM | POA: Diagnosis not present

## 2017-09-01 DIAGNOSIS — C9310 Chronic myelomonocytic leukemia not having achieved remission: Secondary | ICD-10-CM | POA: Diagnosis not present

## 2017-09-05 DIAGNOSIS — R51 Headache: Secondary | ICD-10-CM | POA: Diagnosis not present

## 2017-09-05 DIAGNOSIS — R600 Localized edema: Secondary | ICD-10-CM | POA: Diagnosis not present

## 2017-09-05 DIAGNOSIS — R110 Nausea: Secondary | ICD-10-CM | POA: Diagnosis not present

## 2017-09-05 DIAGNOSIS — I4892 Unspecified atrial flutter: Secondary | ICD-10-CM | POA: Diagnosis not present

## 2017-09-05 DIAGNOSIS — M25552 Pain in left hip: Secondary | ICD-10-CM | POA: Diagnosis not present

## 2017-09-05 DIAGNOSIS — E039 Hypothyroidism, unspecified: Secondary | ICD-10-CM | POA: Diagnosis not present

## 2017-09-05 DIAGNOSIS — M778 Other enthesopathies, not elsewhere classified: Secondary | ICD-10-CM | POA: Diagnosis not present

## 2017-09-05 DIAGNOSIS — Z79899 Other long term (current) drug therapy: Secondary | ICD-10-CM | POA: Diagnosis not present

## 2017-09-05 DIAGNOSIS — Z7951 Long term (current) use of inhaled steroids: Secondary | ICD-10-CM | POA: Diagnosis not present

## 2017-09-05 DIAGNOSIS — C9200 Acute myeloblastic leukemia, not having achieved remission: Secondary | ICD-10-CM | POA: Diagnosis not present

## 2017-09-05 DIAGNOSIS — F418 Other specified anxiety disorders: Secondary | ICD-10-CM | POA: Diagnosis not present

## 2017-09-05 DIAGNOSIS — M25551 Pain in right hip: Secondary | ICD-10-CM | POA: Diagnosis not present

## 2017-09-05 DIAGNOSIS — K5900 Constipation, unspecified: Secondary | ICD-10-CM | POA: Diagnosis not present

## 2017-09-05 DIAGNOSIS — D499 Neoplasm of unspecified behavior of unspecified site: Secondary | ICD-10-CM | POA: Diagnosis not present

## 2017-09-05 DIAGNOSIS — D709 Neutropenia, unspecified: Secondary | ICD-10-CM | POA: Diagnosis not present

## 2017-09-05 DIAGNOSIS — I10 Essential (primary) hypertension: Secondary | ICD-10-CM | POA: Diagnosis not present

## 2017-09-05 DIAGNOSIS — Z9889 Other specified postprocedural states: Secondary | ICD-10-CM | POA: Diagnosis not present

## 2017-09-05 DIAGNOSIS — J449 Chronic obstructive pulmonary disease, unspecified: Secondary | ICD-10-CM | POA: Diagnosis not present

## 2017-09-05 DIAGNOSIS — Z9981 Dependence on supplemental oxygen: Secondary | ICD-10-CM | POA: Diagnosis not present

## 2017-09-09 DIAGNOSIS — C9310 Chronic myelomonocytic leukemia not having achieved remission: Secondary | ICD-10-CM | POA: Diagnosis not present

## 2017-09-12 DIAGNOSIS — C9200 Acute myeloblastic leukemia, not having achieved remission: Secondary | ICD-10-CM | POA: Diagnosis not present

## 2017-09-12 DIAGNOSIS — D499 Neoplasm of unspecified behavior of unspecified site: Secondary | ICD-10-CM | POA: Diagnosis not present

## 2017-09-15 DIAGNOSIS — C9200 Acute myeloblastic leukemia, not having achieved remission: Secondary | ICD-10-CM | POA: Diagnosis not present

## 2017-09-15 DIAGNOSIS — I4892 Unspecified atrial flutter: Secondary | ICD-10-CM | POA: Diagnosis not present

## 2017-09-15 DIAGNOSIS — K219 Gastro-esophageal reflux disease without esophagitis: Secondary | ICD-10-CM | POA: Diagnosis not present

## 2017-09-15 DIAGNOSIS — I10 Essential (primary) hypertension: Secondary | ICD-10-CM | POA: Diagnosis not present

## 2017-09-15 DIAGNOSIS — E039 Hypothyroidism, unspecified: Secondary | ICD-10-CM | POA: Diagnosis not present

## 2017-09-15 DIAGNOSIS — Z452 Encounter for adjustment and management of vascular access device: Secondary | ICD-10-CM | POA: Diagnosis not present

## 2017-09-16 DIAGNOSIS — Z9981 Dependence on supplemental oxygen: Secondary | ICD-10-CM | POA: Diagnosis not present

## 2017-09-16 DIAGNOSIS — R630 Anorexia: Secondary | ICD-10-CM | POA: Diagnosis not present

## 2017-09-16 DIAGNOSIS — M25552 Pain in left hip: Secondary | ICD-10-CM | POA: Diagnosis not present

## 2017-09-16 DIAGNOSIS — R110 Nausea: Secondary | ICD-10-CM | POA: Diagnosis not present

## 2017-09-16 DIAGNOSIS — K219 Gastro-esophageal reflux disease without esophagitis: Secondary | ICD-10-CM | POA: Diagnosis not present

## 2017-09-16 DIAGNOSIS — R600 Localized edema: Secondary | ICD-10-CM | POA: Diagnosis not present

## 2017-09-16 DIAGNOSIS — F418 Other specified anxiety disorders: Secondary | ICD-10-CM | POA: Diagnosis not present

## 2017-09-16 DIAGNOSIS — L538 Other specified erythematous conditions: Secondary | ICD-10-CM | POA: Diagnosis not present

## 2017-09-16 DIAGNOSIS — I10 Essential (primary) hypertension: Secondary | ICD-10-CM | POA: Diagnosis not present

## 2017-09-16 DIAGNOSIS — Z7951 Long term (current) use of inhaled steroids: Secondary | ICD-10-CM | POA: Diagnosis not present

## 2017-09-16 DIAGNOSIS — E039 Hypothyroidism, unspecified: Secondary | ICD-10-CM | POA: Diagnosis not present

## 2017-09-16 DIAGNOSIS — J449 Chronic obstructive pulmonary disease, unspecified: Secondary | ICD-10-CM | POA: Diagnosis not present

## 2017-09-16 DIAGNOSIS — R32 Unspecified urinary incontinence: Secondary | ICD-10-CM | POA: Diagnosis not present

## 2017-09-16 DIAGNOSIS — I4892 Unspecified atrial flutter: Secondary | ICD-10-CM | POA: Diagnosis not present

## 2017-09-16 DIAGNOSIS — M25551 Pain in right hip: Secondary | ICD-10-CM | POA: Diagnosis not present

## 2017-09-16 DIAGNOSIS — C9201 Acute myeloblastic leukemia, in remission: Secondary | ICD-10-CM | POA: Diagnosis not present

## 2017-09-16 DIAGNOSIS — C9200 Acute myeloblastic leukemia, not having achieved remission: Secondary | ICD-10-CM | POA: Diagnosis not present

## 2017-09-16 DIAGNOSIS — Z79899 Other long term (current) drug therapy: Secondary | ICD-10-CM | POA: Diagnosis not present

## 2017-09-16 DIAGNOSIS — K5900 Constipation, unspecified: Secondary | ICD-10-CM | POA: Diagnosis not present

## 2017-09-17 DIAGNOSIS — Z5111 Encounter for antineoplastic chemotherapy: Secondary | ICD-10-CM | POA: Diagnosis not present

## 2017-09-17 DIAGNOSIS — C9310 Chronic myelomonocytic leukemia not having achieved remission: Secondary | ICD-10-CM | POA: Diagnosis not present

## 2017-09-17 DIAGNOSIS — C9200 Acute myeloblastic leukemia, not having achieved remission: Secondary | ICD-10-CM | POA: Diagnosis not present

## 2017-09-18 DIAGNOSIS — C9200 Acute myeloblastic leukemia, not having achieved remission: Secondary | ICD-10-CM | POA: Diagnosis not present

## 2017-09-18 DIAGNOSIS — Z5111 Encounter for antineoplastic chemotherapy: Secondary | ICD-10-CM | POA: Diagnosis not present

## 2017-09-19 DIAGNOSIS — C9200 Acute myeloblastic leukemia, not having achieved remission: Secondary | ICD-10-CM | POA: Diagnosis not present

## 2017-09-19 DIAGNOSIS — I4892 Unspecified atrial flutter: Secondary | ICD-10-CM | POA: Diagnosis not present

## 2017-09-19 DIAGNOSIS — Z79899 Other long term (current) drug therapy: Secondary | ICD-10-CM | POA: Diagnosis not present

## 2017-09-19 DIAGNOSIS — R32 Unspecified urinary incontinence: Secondary | ICD-10-CM | POA: Diagnosis not present

## 2017-09-19 DIAGNOSIS — C9201 Acute myeloblastic leukemia, in remission: Secondary | ICD-10-CM | POA: Diagnosis not present

## 2017-09-19 DIAGNOSIS — N179 Acute kidney failure, unspecified: Secondary | ICD-10-CM | POA: Diagnosis not present

## 2017-09-19 DIAGNOSIS — J449 Chronic obstructive pulmonary disease, unspecified: Secondary | ICD-10-CM | POA: Diagnosis not present

## 2017-09-19 DIAGNOSIS — F418 Other specified anxiety disorders: Secondary | ICD-10-CM | POA: Diagnosis not present

## 2017-09-19 DIAGNOSIS — T148XXA Other injury of unspecified body region, initial encounter: Secondary | ICD-10-CM | POA: Diagnosis not present

## 2017-09-19 DIAGNOSIS — K219 Gastro-esophageal reflux disease without esophagitis: Secondary | ICD-10-CM | POA: Diagnosis not present

## 2017-09-19 DIAGNOSIS — I10 Essential (primary) hypertension: Secondary | ICD-10-CM | POA: Diagnosis not present

## 2017-09-19 DIAGNOSIS — Z9889 Other specified postprocedural states: Secondary | ICD-10-CM | POA: Diagnosis not present

## 2017-09-19 DIAGNOSIS — R110 Nausea: Secondary | ICD-10-CM | POA: Diagnosis not present

## 2017-09-22 DIAGNOSIS — C9310 Chronic myelomonocytic leukemia not having achieved remission: Secondary | ICD-10-CM | POA: Diagnosis not present

## 2017-09-25 DIAGNOSIS — C9201 Acute myeloblastic leukemia, in remission: Secondary | ICD-10-CM | POA: Diagnosis not present

## 2017-09-25 DIAGNOSIS — C9310 Chronic myelomonocytic leukemia not having achieved remission: Secondary | ICD-10-CM | POA: Diagnosis not present

## 2017-09-30 DIAGNOSIS — C9310 Chronic myelomonocytic leukemia not having achieved remission: Secondary | ICD-10-CM | POA: Diagnosis not present

## 2017-10-01 DIAGNOSIS — C9310 Chronic myelomonocytic leukemia not having achieved remission: Secondary | ICD-10-CM | POA: Diagnosis not present

## 2017-10-01 DIAGNOSIS — Z452 Encounter for adjustment and management of vascular access device: Secondary | ICD-10-CM | POA: Diagnosis not present

## 2017-10-03 DIAGNOSIS — C9201 Acute myeloblastic leukemia, in remission: Secondary | ICD-10-CM | POA: Diagnosis not present

## 2017-10-07 DIAGNOSIS — F419 Anxiety disorder, unspecified: Secondary | ICD-10-CM | POA: Diagnosis not present

## 2017-10-07 DIAGNOSIS — Z95828 Presence of other vascular implants and grafts: Secondary | ICD-10-CM | POA: Diagnosis not present

## 2017-10-07 DIAGNOSIS — K5900 Constipation, unspecified: Secondary | ICD-10-CM | POA: Diagnosis not present

## 2017-10-07 DIAGNOSIS — R600 Localized edema: Secondary | ICD-10-CM | POA: Diagnosis not present

## 2017-10-07 DIAGNOSIS — K219 Gastro-esophageal reflux disease without esophagitis: Secondary | ICD-10-CM | POA: Diagnosis not present

## 2017-10-07 DIAGNOSIS — R040 Epistaxis: Secondary | ICD-10-CM | POA: Diagnosis not present

## 2017-10-07 DIAGNOSIS — I4892 Unspecified atrial flutter: Secondary | ICD-10-CM | POA: Diagnosis not present

## 2017-10-07 DIAGNOSIS — M769 Unspecified enthesopathy, lower limb, excluding foot: Secondary | ICD-10-CM | POA: Diagnosis not present

## 2017-10-07 DIAGNOSIS — E039 Hypothyroidism, unspecified: Secondary | ICD-10-CM | POA: Diagnosis not present

## 2017-10-07 DIAGNOSIS — H918X3 Other specified hearing loss, bilateral: Secondary | ICD-10-CM | POA: Diagnosis not present

## 2017-10-07 DIAGNOSIS — K5909 Other constipation: Secondary | ICD-10-CM | POA: Diagnosis not present

## 2017-10-07 DIAGNOSIS — D61818 Other pancytopenia: Secondary | ICD-10-CM | POA: Diagnosis not present

## 2017-10-07 DIAGNOSIS — J449 Chronic obstructive pulmonary disease, unspecified: Secondary | ICD-10-CM | POA: Diagnosis not present

## 2017-10-07 DIAGNOSIS — C9201 Acute myeloblastic leukemia, in remission: Secondary | ICD-10-CM | POA: Diagnosis not present

## 2017-10-07 DIAGNOSIS — I10 Essential (primary) hypertension: Secondary | ICD-10-CM | POA: Diagnosis not present

## 2017-10-07 DIAGNOSIS — F329 Major depressive disorder, single episode, unspecified: Secondary | ICD-10-CM | POA: Diagnosis not present

## 2017-10-07 DIAGNOSIS — H9193 Unspecified hearing loss, bilateral: Secondary | ICD-10-CM | POA: Diagnosis not present

## 2017-10-07 DIAGNOSIS — D696 Thrombocytopenia, unspecified: Secondary | ICD-10-CM | POA: Diagnosis not present

## 2017-10-07 DIAGNOSIS — D649 Anemia, unspecified: Secondary | ICD-10-CM | POA: Diagnosis not present

## 2017-10-07 DIAGNOSIS — C9200 Acute myeloblastic leukemia, not having achieved remission: Secondary | ICD-10-CM | POA: Diagnosis not present

## 2017-10-07 DIAGNOSIS — R110 Nausea: Secondary | ICD-10-CM | POA: Diagnosis not present

## 2017-10-07 DIAGNOSIS — K602 Anal fissure, unspecified: Secondary | ICD-10-CM | POA: Diagnosis not present

## 2017-10-10 DIAGNOSIS — C9201 Acute myeloblastic leukemia, in remission: Secondary | ICD-10-CM | POA: Diagnosis not present

## 2017-10-10 DIAGNOSIS — R600 Localized edema: Secondary | ICD-10-CM | POA: Diagnosis not present

## 2017-10-10 DIAGNOSIS — R110 Nausea: Secondary | ICD-10-CM | POA: Diagnosis not present

## 2017-10-10 DIAGNOSIS — I484 Atypical atrial flutter: Secondary | ICD-10-CM | POA: Diagnosis not present

## 2017-10-10 DIAGNOSIS — E039 Hypothyroidism, unspecified: Secondary | ICD-10-CM | POA: Diagnosis not present

## 2017-10-10 DIAGNOSIS — J449 Chronic obstructive pulmonary disease, unspecified: Secondary | ICD-10-CM | POA: Diagnosis not present

## 2017-10-10 DIAGNOSIS — N179 Acute kidney failure, unspecified: Secondary | ICD-10-CM | POA: Diagnosis not present

## 2017-10-10 DIAGNOSIS — R42 Dizziness and giddiness: Secondary | ICD-10-CM | POA: Diagnosis not present

## 2017-10-10 DIAGNOSIS — M25552 Pain in left hip: Secondary | ICD-10-CM | POA: Diagnosis not present

## 2017-10-10 DIAGNOSIS — C9200 Acute myeloblastic leukemia, not having achieved remission: Secondary | ICD-10-CM | POA: Diagnosis not present

## 2017-10-10 DIAGNOSIS — I959 Hypotension, unspecified: Secondary | ICD-10-CM | POA: Diagnosis not present

## 2017-10-10 DIAGNOSIS — R32 Unspecified urinary incontinence: Secondary | ICD-10-CM | POA: Diagnosis not present

## 2017-10-10 DIAGNOSIS — I10 Essential (primary) hypertension: Secondary | ICD-10-CM | POA: Diagnosis not present

## 2017-10-10 DIAGNOSIS — Z9981 Dependence on supplemental oxygen: Secondary | ICD-10-CM | POA: Diagnosis not present

## 2017-10-14 DIAGNOSIS — R32 Unspecified urinary incontinence: Secondary | ICD-10-CM | POA: Diagnosis not present

## 2017-10-14 DIAGNOSIS — C9200 Acute myeloblastic leukemia, not having achieved remission: Secondary | ICD-10-CM | POA: Diagnosis not present

## 2017-10-14 DIAGNOSIS — R600 Localized edema: Secondary | ICD-10-CM | POA: Diagnosis not present

## 2017-10-14 DIAGNOSIS — I4892 Unspecified atrial flutter: Secondary | ICD-10-CM | POA: Diagnosis not present

## 2017-10-14 DIAGNOSIS — R110 Nausea: Secondary | ICD-10-CM | POA: Diagnosis not present

## 2017-10-14 DIAGNOSIS — K219 Gastro-esophageal reflux disease without esophagitis: Secondary | ICD-10-CM | POA: Diagnosis not present

## 2017-10-14 DIAGNOSIS — J449 Chronic obstructive pulmonary disease, unspecified: Secondary | ICD-10-CM | POA: Diagnosis not present

## 2017-10-14 DIAGNOSIS — I959 Hypotension, unspecified: Secondary | ICD-10-CM | POA: Diagnosis not present

## 2017-10-14 DIAGNOSIS — E039 Hypothyroidism, unspecified: Secondary | ICD-10-CM | POA: Diagnosis not present

## 2017-10-14 DIAGNOSIS — I10 Essential (primary) hypertension: Secondary | ICD-10-CM | POA: Diagnosis not present

## 2017-10-14 DIAGNOSIS — C9201 Acute myeloblastic leukemia, in remission: Secondary | ICD-10-CM | POA: Diagnosis not present

## 2017-10-14 DIAGNOSIS — D708 Other neutropenia: Secondary | ICD-10-CM | POA: Diagnosis not present

## 2017-10-14 DIAGNOSIS — D696 Thrombocytopenia, unspecified: Secondary | ICD-10-CM | POA: Diagnosis not present

## 2017-10-16 DIAGNOSIS — C9201 Acute myeloblastic leukemia, in remission: Secondary | ICD-10-CM | POA: Diagnosis not present

## 2017-10-16 DIAGNOSIS — H906 Mixed conductive and sensorineural hearing loss, bilateral: Secondary | ICD-10-CM | POA: Diagnosis not present

## 2017-10-21 DIAGNOSIS — I4892 Unspecified atrial flutter: Secondary | ICD-10-CM | POA: Diagnosis not present

## 2017-10-21 DIAGNOSIS — R600 Localized edema: Secondary | ICD-10-CM | POA: Diagnosis not present

## 2017-10-21 DIAGNOSIS — R110 Nausea: Secondary | ICD-10-CM | POA: Diagnosis not present

## 2017-10-21 DIAGNOSIS — D696 Thrombocytopenia, unspecified: Secondary | ICD-10-CM | POA: Diagnosis not present

## 2017-10-21 DIAGNOSIS — I10 Essential (primary) hypertension: Secondary | ICD-10-CM | POA: Diagnosis not present

## 2017-10-21 DIAGNOSIS — C9201 Acute myeloblastic leukemia, in remission: Secondary | ICD-10-CM | POA: Diagnosis not present

## 2017-10-21 DIAGNOSIS — R32 Unspecified urinary incontinence: Secondary | ICD-10-CM | POA: Diagnosis not present

## 2017-10-21 DIAGNOSIS — J449 Chronic obstructive pulmonary disease, unspecified: Secondary | ICD-10-CM | POA: Diagnosis not present

## 2017-10-21 DIAGNOSIS — K5900 Constipation, unspecified: Secondary | ICD-10-CM | POA: Diagnosis not present

## 2017-10-21 DIAGNOSIS — D709 Neutropenia, unspecified: Secondary | ICD-10-CM | POA: Diagnosis not present

## 2017-10-21 DIAGNOSIS — R040 Epistaxis: Secondary | ICD-10-CM | POA: Diagnosis not present

## 2017-10-21 DIAGNOSIS — K219 Gastro-esophageal reflux disease without esophagitis: Secondary | ICD-10-CM | POA: Diagnosis not present

## 2017-10-24 DIAGNOSIS — K5900 Constipation, unspecified: Secondary | ICD-10-CM | POA: Diagnosis not present

## 2017-10-24 DIAGNOSIS — R7881 Bacteremia: Secondary | ICD-10-CM | POA: Diagnosis not present

## 2017-10-24 DIAGNOSIS — Z1630 Resistance to unspecified antimicrobial drugs: Secondary | ICD-10-CM | POA: Diagnosis not present

## 2017-10-24 DIAGNOSIS — Z9221 Personal history of antineoplastic chemotherapy: Secondary | ICD-10-CM | POA: Diagnosis not present

## 2017-10-24 DIAGNOSIS — L988 Other specified disorders of the skin and subcutaneous tissue: Secondary | ICD-10-CM | POA: Diagnosis not present

## 2017-10-24 DIAGNOSIS — I10 Essential (primary) hypertension: Secondary | ICD-10-CM | POA: Diagnosis not present

## 2017-10-24 DIAGNOSIS — K219 Gastro-esophageal reflux disease without esophagitis: Secondary | ICD-10-CM | POA: Diagnosis not present

## 2017-10-24 DIAGNOSIS — D709 Neutropenia, unspecified: Secondary | ICD-10-CM | POA: Diagnosis not present

## 2017-10-24 DIAGNOSIS — T451X5A Adverse effect of antineoplastic and immunosuppressive drugs, initial encounter: Secondary | ICD-10-CM | POA: Diagnosis present

## 2017-10-24 DIAGNOSIS — Z7901 Long term (current) use of anticoagulants: Secondary | ICD-10-CM | POA: Diagnosis not present

## 2017-10-24 DIAGNOSIS — F329 Major depressive disorder, single episode, unspecified: Secondary | ICD-10-CM | POA: Diagnosis present

## 2017-10-24 DIAGNOSIS — L88 Pyoderma gangrenosum: Secondary | ICD-10-CM | POA: Diagnosis not present

## 2017-10-24 DIAGNOSIS — D701 Agranulocytosis secondary to cancer chemotherapy: Secondary | ICD-10-CM | POA: Diagnosis not present

## 2017-10-24 DIAGNOSIS — D61810 Antineoplastic chemotherapy induced pancytopenia: Secondary | ICD-10-CM | POA: Diagnosis not present

## 2017-10-24 DIAGNOSIS — Z90710 Acquired absence of both cervix and uterus: Secondary | ICD-10-CM | POA: Diagnosis not present

## 2017-10-24 DIAGNOSIS — H9190 Unspecified hearing loss, unspecified ear: Secondary | ICD-10-CM | POA: Diagnosis not present

## 2017-10-24 DIAGNOSIS — R040 Epistaxis: Secondary | ICD-10-CM | POA: Diagnosis not present

## 2017-10-24 DIAGNOSIS — L080 Pyoderma: Secondary | ICD-10-CM | POA: Diagnosis not present

## 2017-10-24 DIAGNOSIS — R509 Fever, unspecified: Secondary | ICD-10-CM | POA: Diagnosis not present

## 2017-10-24 DIAGNOSIS — L02211 Cutaneous abscess of abdominal wall: Secondary | ICD-10-CM | POA: Diagnosis present

## 2017-10-24 DIAGNOSIS — R110 Nausea: Secondary | ICD-10-CM | POA: Diagnosis not present

## 2017-10-24 DIAGNOSIS — Z85828 Personal history of other malignant neoplasm of skin: Secondary | ICD-10-CM | POA: Diagnosis not present

## 2017-10-24 DIAGNOSIS — Z1619 Resistance to other specified beta lactam antibiotics: Secondary | ICD-10-CM | POA: Diagnosis not present

## 2017-10-24 DIAGNOSIS — E039 Hypothyroidism, unspecified: Secondary | ICD-10-CM | POA: Diagnosis not present

## 2017-10-24 DIAGNOSIS — C9250 Acute myelomonocytic leukemia, not having achieved remission: Secondary | ICD-10-CM | POA: Diagnosis present

## 2017-10-24 DIAGNOSIS — R21 Rash and other nonspecific skin eruption: Secondary | ICD-10-CM | POA: Diagnosis not present

## 2017-10-24 DIAGNOSIS — N179 Acute kidney failure, unspecified: Secondary | ICD-10-CM | POA: Diagnosis not present

## 2017-10-24 DIAGNOSIS — B957 Other staphylococcus as the cause of diseases classified elsewhere: Secondary | ICD-10-CM | POA: Diagnosis not present

## 2017-10-24 DIAGNOSIS — D61818 Other pancytopenia: Secondary | ICD-10-CM | POA: Diagnosis not present

## 2017-10-24 DIAGNOSIS — F419 Anxiety disorder, unspecified: Secondary | ICD-10-CM | POA: Diagnosis present

## 2017-10-24 DIAGNOSIS — R600 Localized edema: Secondary | ICD-10-CM | POA: Diagnosis not present

## 2017-10-24 DIAGNOSIS — R5081 Fever presenting with conditions classified elsewhere: Secondary | ICD-10-CM | POA: Diagnosis not present

## 2017-10-24 DIAGNOSIS — I4892 Unspecified atrial flutter: Secondary | ICD-10-CM | POA: Diagnosis not present

## 2017-10-24 DIAGNOSIS — R32 Unspecified urinary incontinence: Secondary | ICD-10-CM | POA: Diagnosis not present

## 2017-10-24 DIAGNOSIS — Z9049 Acquired absence of other specified parts of digestive tract: Secondary | ICD-10-CM | POA: Diagnosis not present

## 2017-10-24 DIAGNOSIS — Z79899 Other long term (current) drug therapy: Secondary | ICD-10-CM | POA: Diagnosis not present

## 2017-10-24 DIAGNOSIS — R112 Nausea with vomiting, unspecified: Secondary | ICD-10-CM | POA: Diagnosis not present

## 2017-10-24 DIAGNOSIS — L308 Other specified dermatitis: Secondary | ICD-10-CM | POA: Diagnosis not present

## 2017-10-24 DIAGNOSIS — C9200 Acute myeloblastic leukemia, not having achieved remission: Secondary | ICD-10-CM | POA: Diagnosis not present

## 2017-10-24 DIAGNOSIS — Z7951 Long term (current) use of inhaled steroids: Secondary | ICD-10-CM | POA: Diagnosis not present

## 2017-10-24 DIAGNOSIS — J449 Chronic obstructive pulmonary disease, unspecified: Secondary | ICD-10-CM | POA: Diagnosis not present

## 2017-10-24 DIAGNOSIS — R1900 Intra-abdominal and pelvic swelling, mass and lump, unspecified site: Secondary | ICD-10-CM | POA: Diagnosis not present

## 2017-10-24 DIAGNOSIS — B965 Pseudomonas (aeruginosa) (mallei) (pseudomallei) as the cause of diseases classified elsewhere: Secondary | ICD-10-CM | POA: Diagnosis not present

## 2017-11-11 ENCOUNTER — Encounter (HOSPITAL_COMMUNITY): Payer: Self-pay | Admitting: Hematology

## 2017-11-11 ENCOUNTER — Inpatient Hospital Stay (HOSPITAL_COMMUNITY): Payer: Medicare Other | Attending: Hematology | Admitting: Hematology

## 2017-11-11 ENCOUNTER — Other Ambulatory Visit: Payer: Self-pay

## 2017-11-11 DIAGNOSIS — R600 Localized edema: Secondary | ICD-10-CM | POA: Diagnosis not present

## 2017-11-11 DIAGNOSIS — I4892 Unspecified atrial flutter: Secondary | ICD-10-CM | POA: Diagnosis not present

## 2017-11-11 DIAGNOSIS — C9200 Acute myeloblastic leukemia, not having achieved remission: Secondary | ICD-10-CM | POA: Diagnosis not present

## 2017-11-11 DIAGNOSIS — N189 Chronic kidney disease, unspecified: Secondary | ICD-10-CM | POA: Insufficient documentation

## 2017-11-11 DIAGNOSIS — R194 Change in bowel habit: Secondary | ICD-10-CM | POA: Insufficient documentation

## 2017-11-11 DIAGNOSIS — Z79899 Other long term (current) drug therapy: Secondary | ICD-10-CM | POA: Diagnosis not present

## 2017-11-11 DIAGNOSIS — I129 Hypertensive chronic kidney disease with stage 1 through stage 4 chronic kidney disease, or unspecified chronic kidney disease: Secondary | ICD-10-CM | POA: Diagnosis not present

## 2017-11-11 NOTE — Patient Instructions (Signed)
[facility] Discharge Instructions     Thank you for choosing [facility] at [facility] to provide your oncology and hematology care.  To afford each patient quality time with our provider, please arrive at least 15 minutes before your scheduled appointment time.   If you have a lab appointment with the Cancer Center please come in thru the  Main Entrance and check in at the main information desk  You need to re-schedule your appointment should you arrive 10 or more minutes late.  We strive to give you quality time with our providers, and arriving late affects you and other patients whose appointments are after yours.  Also, if you no show three or more times for appointments you may be dismissed from the clinic at the providers discretion.     Again, thank you for choosing [facility].  Our hope is that these requests will decrease the amount of time that you wait before being seen by our physicians.       _____________________________________________________________  Should you have questions after your visit to [facility], please contact our office at [phone] between the hours of 8:00 a.m. and 4:30 p.m.  Voicemails left after 4:00 p.m. will not be returned until the following business day.  For prescription refill requests, have your pharmacy contact our office and allow 72 hours.    Cancer Center Support Programs:   > Cancer Support Group  2nd Tuesday of the month 1pm-2pm, Journey Room    
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
## 2017-11-11 NOTE — Assessment & Plan Note (Addendum)
1.  Acute myeloid leukemia: -History of CMML diagnosed 11/27/2012, with splenomegaly, followed with observation. - Acute myelomonocytic leukemia with +8 by FISH and del(6) and del(7) by cytogenetics, FLT-3 negative; NRAS and TET2 positive. - Cycle 1 of chemotherapy with decitabine (20 mg/m) IV daily x5 days plus venetoclax 200 mg daily on 08/09/2017, bone marrow biopsy on 08/29/2017 showing 30 to 40% cellular with erythroid and megakaryocytic predominance and no increase in blasts, normal cytogenetics.  Cycle 2 on 09/15/2017, cycle 3 on 10/27/2017, admitted to the hospital on 10/24/2017 and discharged on 11/10/2017 after treatment for cellulitis in the left lower quadrant of the abdominal wall. - We will monitor her labs twice weekly starting this Thursday.  She has an appointment to see Dr. Florene Glen on 11/18/2017.  2.  ID prophylaxis: - If ANC drops below 500, she will take prophylactic antibiotics with acyclovir, Diflucan and Levaquin.  3.  Bilateral lower extremity edema: -Continue Lasix 40 mg daily.  4.  Cardiac: -History of hypertension and atrial flutter, status post ablation in 2014.  Coumadin on hold as her platelet count is low.  5.  Bowel changes: -She is experiencing loose stools.  She is currently taking MiraLAX.  She also takes 3 fiber tablets.  I have told her to discontinue them.

## 2017-11-12 ENCOUNTER — Other Ambulatory Visit (HOSPITAL_COMMUNITY): Payer: Self-pay

## 2017-11-12 DIAGNOSIS — C9200 Acute myeloblastic leukemia, not having achieved remission: Secondary | ICD-10-CM

## 2017-11-13 ENCOUNTER — Inpatient Hospital Stay (HOSPITAL_COMMUNITY): Payer: Medicare Other

## 2017-11-13 ENCOUNTER — Encounter (HOSPITAL_COMMUNITY): Payer: Self-pay

## 2017-11-13 ENCOUNTER — Other Ambulatory Visit (HOSPITAL_COMMUNITY): Payer: Medicare Other

## 2017-11-13 ENCOUNTER — Other Ambulatory Visit: Payer: Self-pay

## 2017-11-13 VITALS — BP 91/44 | HR 63 | Temp 97.5°F | Resp 18

## 2017-11-13 DIAGNOSIS — D61818 Other pancytopenia: Secondary | ICD-10-CM | POA: Diagnosis not present

## 2017-11-13 DIAGNOSIS — C9201 Acute myeloblastic leukemia, in remission: Secondary | ICD-10-CM | POA: Diagnosis not present

## 2017-11-13 DIAGNOSIS — C9200 Acute myeloblastic leukemia, not having achieved remission: Secondary | ICD-10-CM

## 2017-11-13 DIAGNOSIS — Z95828 Presence of other vascular implants and grafts: Secondary | ICD-10-CM | POA: Diagnosis not present

## 2017-11-13 DIAGNOSIS — I129 Hypertensive chronic kidney disease with stage 1 through stage 4 chronic kidney disease, or unspecified chronic kidney disease: Secondary | ICD-10-CM | POA: Diagnosis not present

## 2017-11-13 DIAGNOSIS — N189 Chronic kidney disease, unspecified: Secondary | ICD-10-CM | POA: Diagnosis not present

## 2017-11-13 DIAGNOSIS — D701 Agranulocytosis secondary to cancer chemotherapy: Secondary | ICD-10-CM | POA: Diagnosis not present

## 2017-11-13 DIAGNOSIS — T451X5A Adverse effect of antineoplastic and immunosuppressive drugs, initial encounter: Secondary | ICD-10-CM | POA: Diagnosis not present

## 2017-11-13 DIAGNOSIS — C9310 Chronic myelomonocytic leukemia not having achieved remission: Secondary | ICD-10-CM | POA: Diagnosis not present

## 2017-11-13 DIAGNOSIS — D61810 Antineoplastic chemotherapy induced pancytopenia: Secondary | ICD-10-CM | POA: Diagnosis not present

## 2017-11-13 DIAGNOSIS — D696 Thrombocytopenia, unspecified: Secondary | ICD-10-CM | POA: Diagnosis not present

## 2017-11-13 DIAGNOSIS — D709 Neutropenia, unspecified: Secondary | ICD-10-CM | POA: Diagnosis not present

## 2017-11-13 DIAGNOSIS — R600 Localized edema: Secondary | ICD-10-CM | POA: Diagnosis not present

## 2017-11-13 DIAGNOSIS — I4892 Unspecified atrial flutter: Secondary | ICD-10-CM | POA: Diagnosis not present

## 2017-11-13 DIAGNOSIS — F419 Anxiety disorder, unspecified: Secondary | ICD-10-CM | POA: Diagnosis not present

## 2017-11-13 DIAGNOSIS — I10 Essential (primary) hypertension: Secondary | ICD-10-CM | POA: Diagnosis not present

## 2017-11-13 DIAGNOSIS — F39 Unspecified mood [affective] disorder: Secondary | ICD-10-CM | POA: Diagnosis not present

## 2017-11-13 DIAGNOSIS — K5900 Constipation, unspecified: Secondary | ICD-10-CM | POA: Diagnosis present

## 2017-11-13 DIAGNOSIS — E039 Hypothyroidism, unspecified: Secondary | ICD-10-CM | POA: Diagnosis not present

## 2017-11-13 DIAGNOSIS — R194 Change in bowel habit: Secondary | ICD-10-CM | POA: Diagnosis not present

## 2017-11-13 DIAGNOSIS — F329 Major depressive disorder, single episode, unspecified: Secondary | ICD-10-CM | POA: Diagnosis not present

## 2017-11-13 DIAGNOSIS — C9250 Acute myelomonocytic leukemia, not having achieved remission: Secondary | ICD-10-CM | POA: Diagnosis present

## 2017-11-13 LAB — TYPE AND SCREEN
ABO/RH(D): A NEG
ANTIBODY SCREEN: POSITIVE
DAT, IgG: POSITIVE

## 2017-11-13 LAB — DIFFERENTIAL
Abs Immature Granulocytes: 0.01 10*3/uL (ref 0.00–0.07)
Basophils Absolute: 0 10*3/uL (ref 0.0–0.1)
Basophils Relative: 0 %
EOS ABS: 0 10*3/uL (ref 0.0–0.5)
EOS PCT: 0 %
Immature Granulocytes: 1 %
LYMPHS ABS: 0.6 10*3/uL — AB (ref 0.7–4.0)
Lymphocytes Relative: 64 %
MONOS PCT: 4 %
Monocytes Absolute: 0 10*3/uL — ABNORMAL LOW (ref 0.1–1.0)
NEUTROS PCT: 31 %
Neutro Abs: 0.3 10*3/uL — ABNORMAL LOW (ref 1.7–7.7)

## 2017-11-13 LAB — COMPREHENSIVE METABOLIC PANEL
ALT: 43 U/L (ref 0–44)
AST: 38 U/L (ref 15–41)
Albumin: 2.9 g/dL — ABNORMAL LOW (ref 3.5–5.0)
Alkaline Phosphatase: 173 U/L — ABNORMAL HIGH (ref 38–126)
Anion gap: 9 (ref 5–15)
BILIRUBIN TOTAL: 1.3 mg/dL — AB (ref 0.3–1.2)
BUN: 80 mg/dL — AB (ref 8–23)
CO2: 22 mmol/L (ref 22–32)
CREATININE: 1.4 mg/dL — AB (ref 0.44–1.00)
Calcium: 9.4 mg/dL (ref 8.9–10.3)
Chloride: 109 mmol/L (ref 98–111)
GFR, EST AFRICAN AMERICAN: 42 mL/min — AB (ref >60)
GFR, EST NON AFRICAN AMERICAN: 36 mL/min — AB (ref >60)
Glucose, Bld: 167 mg/dL — ABNORMAL HIGH (ref 70–99)
Potassium: 4.5 mmol/L (ref 3.5–5.1)
Sodium: 140 mmol/L (ref 135–145)
TOTAL PROTEIN: 6 g/dL — AB (ref 6.5–8.1)

## 2017-11-13 LAB — CBC
HEMATOCRIT: 17.1 % — AB (ref 36.0–46.0)
HEMOGLOBIN: 5.3 g/dL — AB (ref 12.0–15.0)
MCH: 28.8 pg (ref 26.0–34.0)
MCHC: 31 g/dL (ref 30.0–36.0)
MCV: 92.9 fL (ref 80.0–100.0)
NRBC: 0 % (ref 0.0–0.2)
Platelets: 2 10*3/uL — CL (ref 150–400)
RBC: 1.84 MIL/uL — ABNORMAL LOW (ref 3.87–5.11)
RDW: 15.9 % — AB (ref 11.5–15.5)
WBC: 1 10*3/uL — CL (ref 4.0–10.5)

## 2017-11-13 LAB — ABO/RH: ABO/RH(D): A NEG

## 2017-11-13 LAB — MAGNESIUM: Magnesium: 2.3 mg/dL (ref 1.7–2.4)

## 2017-11-13 MED ORDER — ACETAMINOPHEN 325 MG PO TABS
650.0000 mg | ORAL_TABLET | Freq: Once | ORAL | Status: AC
  Administered 2017-11-13: 650 mg via ORAL
  Filled 2017-11-13: qty 2

## 2017-11-13 MED ORDER — SODIUM CHLORIDE 0.9% IV SOLUTION
250.0000 mL | Freq: Once | INTRAVENOUS | Status: AC
  Administered 2017-11-13: 500 mL via INTRAVENOUS

## 2017-11-13 MED ORDER — DIPHENHYDRAMINE HCL 25 MG PO CAPS
25.0000 mg | ORAL_CAPSULE | Freq: Once | ORAL | Status: AC
  Administered 2017-11-13: 25 mg via ORAL
  Filled 2017-11-13: qty 1

## 2017-11-13 MED ORDER — PROMETHAZINE HCL 25 MG/ML IJ SOLN: 25.0000 mg | Freq: Once | INTRAMUSCULAR | Status: DC

## 2017-11-13 MED ORDER — SODIUM CHLORIDE 0.9 % IV SOLN
Freq: Once | INTRAVENOUS | Status: AC
  Administered 2017-11-13: 10:00:00 via INTRAVENOUS
  Filled 2017-11-13: qty 50

## 2017-11-13 MED ORDER — SODIUM CHLORIDE 0.9 % IV SOLN
INTRAVENOUS | Status: DC
  Administered 2017-11-13: 12:00:00 via INTRAVENOUS

## 2017-11-13 NOTE — Progress Notes (Signed)
Spoke with Margarita Grizzle, nurse for Dr. Florene Glen at [facility].  Informed her of pt's lab results, and also that she would be unable to receive blood transfusion here today d/t the blood being unavailable until tomorrow due to a positive antibody screen, but that we do have platelets available to transfuse.  She will set pt up to receive her blood transfusion there today.  Pt informed of plan and is agreeable to receive platelets here and then go to [facility] to receive her blood.   Tolerated platelet transfusion w/o adverse reaction.  Alert, in no distress.  VSS.  Discharged via wheelchair in c/o spouse.

## 2017-11-14 LAB — PREPARE PLATELET PHERESIS: UNIT DIVISION: 0

## 2017-11-14 LAB — BPAM PLATELET PHERESIS
BLOOD PRODUCT EXPIRATION DATE: 201910172359
ISSUE DATE / TIME: 201910171215
UNIT TYPE AND RH: 7300

## 2017-11-18 DIAGNOSIS — C9201 Acute myeloblastic leukemia, in remission: Secondary | ICD-10-CM | POA: Diagnosis not present

## 2017-11-20 ENCOUNTER — Inpatient Hospital Stay (HOSPITAL_COMMUNITY): Payer: Medicare Other

## 2017-11-20 ENCOUNTER — Encounter (HOSPITAL_COMMUNITY): Payer: Medicare Other

## 2017-11-21 ENCOUNTER — Encounter (HOSPITAL_COMMUNITY): Payer: Medicare Other

## 2017-11-21 DIAGNOSIS — K219 Gastro-esophageal reflux disease without esophagitis: Secondary | ICD-10-CM | POA: Diagnosis not present

## 2017-11-21 DIAGNOSIS — E039 Hypothyroidism, unspecified: Secondary | ICD-10-CM | POA: Diagnosis not present

## 2017-11-21 DIAGNOSIS — C9201 Acute myeloblastic leukemia, in remission: Secondary | ICD-10-CM | POA: Diagnosis not present

## 2017-11-21 DIAGNOSIS — J449 Chronic obstructive pulmonary disease, unspecified: Secondary | ICD-10-CM | POA: Diagnosis not present

## 2017-11-21 DIAGNOSIS — K5900 Constipation, unspecified: Secondary | ICD-10-CM | POA: Diagnosis not present

## 2017-11-23 ENCOUNTER — Other Ambulatory Visit: Payer: Self-pay

## 2017-11-23 ENCOUNTER — Emergency Department (HOSPITAL_COMMUNITY)
Admission: EM | Admit: 2017-11-23 | Discharge: 2017-11-23 | Disposition: A | Discharge status: Other Acute Inpatient Hospital | Payer: Medicare Other | Attending: Emergency Medicine | Admitting: Emergency Medicine

## 2017-11-23 ENCOUNTER — Encounter (HOSPITAL_COMMUNITY): Payer: Self-pay

## 2017-11-23 ENCOUNTER — Emergency Department (HOSPITAL_COMMUNITY): Payer: Medicare Other

## 2017-11-23 DIAGNOSIS — E8770 Fluid overload, unspecified: Secondary | ICD-10-CM | POA: Diagnosis not present

## 2017-11-23 DIAGNOSIS — S91115A Laceration without foreign body of left lesser toe(s) without damage to nail, initial encounter: Secondary | ICD-10-CM | POA: Diagnosis not present

## 2017-11-23 DIAGNOSIS — D61818 Other pancytopenia: Secondary | ICD-10-CM

## 2017-11-23 DIAGNOSIS — R011 Cardiac murmur, unspecified: Secondary | ICD-10-CM | POA: Diagnosis not present

## 2017-11-23 DIAGNOSIS — D61810 Antineoplastic chemotherapy induced pancytopenia: Secondary | ICD-10-CM | POA: Diagnosis not present

## 2017-11-23 DIAGNOSIS — F419 Anxiety disorder, unspecified: Secondary | ICD-10-CM | POA: Diagnosis present

## 2017-11-23 DIAGNOSIS — K5900 Constipation, unspecified: Secondary | ICD-10-CM | POA: Diagnosis not present

## 2017-11-23 DIAGNOSIS — L539 Erythematous condition, unspecified: Secondary | ICD-10-CM | POA: Diagnosis not present

## 2017-11-23 DIAGNOSIS — D699 Hemorrhagic condition, unspecified: Secondary | ICD-10-CM | POA: Diagnosis not present

## 2017-11-23 DIAGNOSIS — I129 Hypertensive chronic kidney disease with stage 1 through stage 4 chronic kidney disease, or unspecified chronic kidney disease: Secondary | ICD-10-CM | POA: Insufficient documentation

## 2017-11-23 DIAGNOSIS — Z79899 Other long term (current) drug therapy: Secondary | ICD-10-CM | POA: Insufficient documentation

## 2017-11-23 DIAGNOSIS — A09 Infectious gastroenteritis and colitis, unspecified: Secondary | ICD-10-CM | POA: Diagnosis present

## 2017-11-23 DIAGNOSIS — I361 Nonrheumatic tricuspid (valve) insufficiency: Secondary | ICD-10-CM | POA: Diagnosis not present

## 2017-11-23 DIAGNOSIS — S92512A Displaced fracture of proximal phalanx of left lesser toe(s), initial encounter for closed fracture: Secondary | ICD-10-CM | POA: Diagnosis not present

## 2017-11-23 DIAGNOSIS — C9250 Acute myelomonocytic leukemia, not having achieved remission: Secondary | ICD-10-CM | POA: Diagnosis present

## 2017-11-23 DIAGNOSIS — N189 Chronic kidney disease, unspecified: Secondary | ICD-10-CM | POA: Diagnosis not present

## 2017-11-23 DIAGNOSIS — I340 Nonrheumatic mitral (valve) insufficiency: Secondary | ICD-10-CM | POA: Diagnosis not present

## 2017-11-23 DIAGNOSIS — Z833 Family history of diabetes mellitus: Secondary | ICD-10-CM | POA: Diagnosis not present

## 2017-11-23 DIAGNOSIS — C9200 Acute myeloblastic leukemia, not having achieved remission: Secondary | ICD-10-CM | POA: Diagnosis not present

## 2017-11-23 DIAGNOSIS — T80319A ABO incompatibility with hemolytic transfusion reaction, unspecified, initial encounter: Secondary | ICD-10-CM | POA: Diagnosis not present

## 2017-11-23 DIAGNOSIS — W231XXA Caught, crushed, jammed, or pinched between stationary objects, initial encounter: Secondary | ICD-10-CM | POA: Diagnosis not present

## 2017-11-23 DIAGNOSIS — T451X5A Adverse effect of antineoplastic and immunosuppressive drugs, initial encounter: Secondary | ICD-10-CM | POA: Diagnosis present

## 2017-11-23 DIAGNOSIS — Z87891 Personal history of nicotine dependence: Secondary | ICD-10-CM | POA: Insufficient documentation

## 2017-11-23 DIAGNOSIS — R5081 Fever presenting with conditions classified elsewhere: Secondary | ICD-10-CM

## 2017-11-23 DIAGNOSIS — C9311 Chronic myelomonocytic leukemia, in remission: Secondary | ICD-10-CM | POA: Diagnosis not present

## 2017-11-23 DIAGNOSIS — R5383 Other fatigue: Secondary | ICD-10-CM | POA: Diagnosis not present

## 2017-11-23 DIAGNOSIS — R5084 Febrile nonhemolytic transfusion reaction: Secondary | ICD-10-CM | POA: Diagnosis not present

## 2017-11-23 DIAGNOSIS — Z90710 Acquired absence of both cervix and uterus: Secondary | ICD-10-CM | POA: Diagnosis not present

## 2017-11-23 DIAGNOSIS — Z85828 Personal history of other malignant neoplasm of skin: Secondary | ICD-10-CM | POA: Diagnosis not present

## 2017-11-23 DIAGNOSIS — K921 Melena: Secondary | ICD-10-CM | POA: Diagnosis not present

## 2017-11-23 DIAGNOSIS — S92532B Displaced fracture of distal phalanx of left lesser toe(s), initial encounter for open fracture: Secondary | ICD-10-CM | POA: Diagnosis not present

## 2017-11-23 DIAGNOSIS — D61811 Other drug-induced pancytopenia: Secondary | ICD-10-CM | POA: Diagnosis not present

## 2017-11-23 DIAGNOSIS — R509 Fever, unspecified: Secondary | ICD-10-CM | POA: Diagnosis not present

## 2017-11-23 DIAGNOSIS — S92515B Nondisplaced fracture of proximal phalanx of left lesser toe(s), initial encounter for open fracture: Secondary | ICD-10-CM | POA: Diagnosis not present

## 2017-11-23 DIAGNOSIS — D696 Thrombocytopenia, unspecified: Secondary | ICD-10-CM | POA: Diagnosis not present

## 2017-11-23 DIAGNOSIS — Z66 Do not resuscitate: Secondary | ICD-10-CM | POA: Diagnosis not present

## 2017-11-23 DIAGNOSIS — Z7901 Long term (current) use of anticoagulants: Secondary | ICD-10-CM | POA: Insufficient documentation

## 2017-11-23 DIAGNOSIS — Z95828 Presence of other vascular implants and grafts: Secondary | ICD-10-CM | POA: Diagnosis not present

## 2017-11-23 DIAGNOSIS — I4892 Unspecified atrial flutter: Secondary | ICD-10-CM | POA: Diagnosis present

## 2017-11-23 DIAGNOSIS — M7989 Other specified soft tissue disorders: Secondary | ICD-10-CM | POA: Diagnosis not present

## 2017-11-23 DIAGNOSIS — F329 Major depressive disorder, single episode, unspecified: Secondary | ICD-10-CM | POA: Diagnosis present

## 2017-11-23 DIAGNOSIS — H9190 Unspecified hearing loss, unspecified ear: Secondary | ICD-10-CM | POA: Diagnosis not present

## 2017-11-23 DIAGNOSIS — D649 Anemia, unspecified: Secondary | ICD-10-CM | POA: Diagnosis not present

## 2017-11-23 DIAGNOSIS — R944 Abnormal results of kidney function studies: Secondary | ICD-10-CM | POA: Diagnosis not present

## 2017-11-23 DIAGNOSIS — E039 Hypothyroidism, unspecified: Secondary | ICD-10-CM | POA: Diagnosis present

## 2017-11-23 DIAGNOSIS — H906 Mixed conductive and sensorineural hearing loss, bilateral: Secondary | ICD-10-CM | POA: Diagnosis present

## 2017-11-23 DIAGNOSIS — I10 Essential (primary) hypertension: Secondary | ICD-10-CM | POA: Diagnosis present

## 2017-11-23 DIAGNOSIS — I272 Pulmonary hypertension, unspecified: Secondary | ICD-10-CM | POA: Diagnosis not present

## 2017-11-23 DIAGNOSIS — K920 Hematemesis: Secondary | ICD-10-CM | POA: Diagnosis not present

## 2017-11-23 DIAGNOSIS — R51 Headache: Secondary | ICD-10-CM | POA: Diagnosis not present

## 2017-11-23 DIAGNOSIS — L88 Pyoderma gangrenosum: Secondary | ICD-10-CM | POA: Diagnosis not present

## 2017-11-23 DIAGNOSIS — R54 Age-related physical debility: Secondary | ICD-10-CM | POA: Diagnosis not present

## 2017-11-23 DIAGNOSIS — R600 Localized edema: Secondary | ICD-10-CM | POA: Diagnosis not present

## 2017-11-23 DIAGNOSIS — D709 Neutropenia, unspecified: Secondary | ICD-10-CM

## 2017-11-23 DIAGNOSIS — M79601 Pain in right arm: Secondary | ICD-10-CM | POA: Diagnosis not present

## 2017-11-23 DIAGNOSIS — Z8041 Family history of malignant neoplasm of ovary: Secondary | ICD-10-CM | POA: Diagnosis not present

## 2017-11-23 DIAGNOSIS — R768 Other specified abnormal immunological findings in serum: Secondary | ICD-10-CM | POA: Diagnosis present

## 2017-11-23 DIAGNOSIS — R1110 Vomiting, unspecified: Secondary | ICD-10-CM | POA: Diagnosis not present

## 2017-11-23 DIAGNOSIS — S91312A Laceration without foreign body, left foot, initial encounter: Secondary | ICD-10-CM | POA: Diagnosis not present

## 2017-11-23 DIAGNOSIS — G629 Polyneuropathy, unspecified: Secondary | ICD-10-CM | POA: Diagnosis present

## 2017-11-23 DIAGNOSIS — E876 Hypokalemia: Secondary | ICD-10-CM | POA: Diagnosis not present

## 2017-11-23 DIAGNOSIS — R9431 Abnormal electrocardiogram [ECG] [EKG]: Secondary | ICD-10-CM | POA: Diagnosis not present

## 2017-11-23 DIAGNOSIS — Q211 Atrial septal defect: Secondary | ICD-10-CM | POA: Diagnosis not present

## 2017-11-23 LAB — COMPREHENSIVE METABOLIC PANEL
ALBUMIN: 2.8 g/dL — AB (ref 3.5–5.0)
ALK PHOS: 233 U/L — AB (ref 38–126)
ALT: 45 U/L — AB (ref 0–44)
ANION GAP: 7 (ref 5–15)
AST: 48 U/L — ABNORMAL HIGH (ref 15–41)
BUN: 61 mg/dL — ABNORMAL HIGH (ref 8–23)
CALCIUM: 8.4 mg/dL — AB (ref 8.9–10.3)
CO2: 23 mmol/L (ref 22–32)
CREATININE: 1.48 mg/dL — AB (ref 0.44–1.00)
Chloride: 107 mmol/L (ref 98–111)
GFR calc Af Amer: 39 mL/min — ABNORMAL LOW (ref >60)
GFR calc non Af Amer: 34 mL/min — ABNORMAL LOW (ref >60)
GLUCOSE: 151 mg/dL — AB (ref 70–99)
Potassium: 4.1 mmol/L (ref 3.5–5.1)
Sodium: 137 mmol/L (ref 135–145)
TOTAL PROTEIN: 5.8 g/dL — AB (ref 6.5–8.1)
Total Bilirubin: 2 mg/dL — ABNORMAL HIGH (ref 0.3–1.2)

## 2017-11-23 LAB — I-STAT CG4 LACTIC ACID, ED
LACTIC ACID, VENOUS: 1.92 mmol/L — AB (ref 0.5–1.9)
Lactic Acid, Venous: 2.32 mmol/L (ref 0.5–1.9)

## 2017-11-23 LAB — CBC WITH DIFFERENTIAL/PLATELET
ABS IMMATURE GRANULOCYTES: 0 10*3/uL (ref 0.00–0.07)
Basophils Absolute: 0 10*3/uL (ref 0.0–0.1)
Basophils Relative: 0 %
EOS PCT: 0 %
Eosinophils Absolute: 0 10*3/uL (ref 0.0–0.5)
HCT: 21.5 % — ABNORMAL LOW (ref 36.0–46.0)
HEMOGLOBIN: 6.6 g/dL — AB (ref 12.0–15.0)
IMMATURE GRANULOCYTES: 0 %
LYMPHS ABS: 0.1 10*3/uL — AB (ref 0.7–4.0)
LYMPHS PCT: 72 %
MCH: 28.6 pg (ref 26.0–34.0)
MCHC: 30.7 g/dL (ref 30.0–36.0)
MCV: 93.1 fL (ref 80.0–100.0)
MONO ABS: 0 10*3/uL — AB (ref 0.1–1.0)
MONOS PCT: 17 %
NEUTROS ABS: 0 10*3/uL — AB (ref 1.7–7.7)
Neutrophils Relative %: 11 %
Platelets: 2 10*3/uL — CL (ref 150–400)
RBC: 2.31 MIL/uL — ABNORMAL LOW (ref 3.87–5.11)
RDW: 15.4 % (ref 11.5–15.5)
WBC: 0.2 10*3/uL — CL (ref 4.0–10.5)
nRBC: 0 % (ref 0.0–0.2)

## 2017-11-23 LAB — URINALYSIS, ROUTINE W REFLEX MICROSCOPIC
BACTERIA UA: NONE SEEN
Bilirubin Urine: NEGATIVE
GLUCOSE, UA: NEGATIVE mg/dL
KETONES UR: NEGATIVE mg/dL
Leukocytes, UA: NEGATIVE
NITRITE: NEGATIVE
PROTEIN: NEGATIVE mg/dL
Specific Gravity, Urine: 1.009 (ref 1.005–1.030)
pH: 5 (ref 5.0–8.0)

## 2017-11-23 LAB — PROTIME-INR
INR: 1.14
Prothrombin Time: 14.5 seconds (ref 11.4–15.2)

## 2017-11-23 MED ORDER — SODIUM CHLORIDE 0.9 % IV SOLN: 10.0000 mL/h | Freq: Once | INTRAVENOUS | Status: DC

## 2017-11-23 MED ORDER — SODIUM CHLORIDE 0.9 % IV BOLUS (SEPSIS)
1000.0000 mL | Freq: Once | INTRAVENOUS | Status: AC
  Administered 2017-11-23: 1000 mL via INTRAVENOUS

## 2017-11-23 MED ORDER — ACETAMINOPHEN 325 MG PO TABS
650.0000 mg | ORAL_TABLET | Freq: Once | ORAL | Status: AC
  Administered 2017-11-23: 650 mg via ORAL
  Filled 2017-11-23: qty 2

## 2017-11-23 MED ORDER — SODIUM CHLORIDE 0.9 % IV BOLUS
2000.0000 mL | Freq: Once | INTRAVENOUS | Status: AC
  Administered 2017-11-23: 2000 mL via INTRAVENOUS

## 2017-11-23 MED ORDER — SODIUM CHLORIDE 0.9 % IV SOLN
2.0000 g | Freq: Once | INTRAVENOUS | Status: AC
  Administered 2017-11-23: 2 g via INTRAVENOUS
  Filled 2017-11-23: qty 2

## 2017-11-23 MED ORDER — VANCOMYCIN HCL 10 G IV SOLR
2000.0000 mg | Freq: Once | INTRAVENOUS | Status: AC
  Administered 2017-11-23: 2000 mg via INTRAVENOUS
  Filled 2017-11-23 (×2): qty 2000

## 2017-11-23 MED ORDER — SODIUM CHLORIDE 0.9 % IV SOLN
1000.0000 mL | INTRAVENOUS | Status: DC
  Administered 2017-11-23: 1000 mL via INTRAVENOUS

## 2017-11-23 NOTE — ED Notes (Signed)
CRITICAL VALUE ALERT  Critical Value: I stat  Lactic 2.32  Date & Time Notied:  11/23/2017, 1426  Provider Notified: Dr. Tomi Bamberger  Orders Received/Actions taken: see chart

## 2017-11-23 NOTE — ED Notes (Signed)
Spoke with [employer] transport ETA 2 hrs. Pt and family notified. RN aware.
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
## 2017-11-23 NOTE — ED Notes (Addendum)
Provider from [facility] called and reports pt is neutropenic and has had fever and vomiting.  Pt sent here for stabilization and possible transfer to [facility].    Pt's last chemo was 2 weeks ago.

## 2017-11-23 NOTE — ED Notes (Signed)
CRITICAL VALUE ALERT  Critical Value:  Wbc 0.2, hemoglobin 6.6, platelets 2  Date & Time Notied:  11/23/2017, 1457 Provider Notified: Dr. Tomi Bamberger  Orders Received/Actions taken: see chart

## 2017-11-23 NOTE — ED Notes (Signed)
AC called for Vancomycin 2 grams.

## 2017-11-23 NOTE — ED Triage Notes (Signed)
Provder Joslyn Devon can be reached at [phone] and leave numerical call back number.

## 2017-11-24 ENCOUNTER — Other Ambulatory Visit (HOSPITAL_COMMUNITY): Payer: Medicare Other

## 2017-11-24 LAB — BLOOD CULTURE ID PANEL (REFLEXED)
Acinetobacter baumannii: NOT DETECTED
CANDIDA KRUSEI: NOT DETECTED
CANDIDA PARAPSILOSIS: NOT DETECTED
CARBAPENEM RESISTANCE: NOT DETECTED
Candida albicans: NOT DETECTED
Candida glabrata: NOT DETECTED
Candida tropicalis: NOT DETECTED
ENTEROCOCCUS SPECIES: NOT DETECTED
Enterobacter cloacae complex: NOT DETECTED
Enterobacteriaceae species: NOT DETECTED
Escherichia coli: NOT DETECTED
Haemophilus influenzae: NOT DETECTED
KLEBSIELLA OXYTOCA: NOT DETECTED
Klebsiella pneumoniae: NOT DETECTED
LISTERIA MONOCYTOGENES: NOT DETECTED
Neisseria meningitidis: NOT DETECTED
Proteus species: NOT DETECTED
Pseudomonas aeruginosa: DETECTED — AB
STAPHYLOCOCCUS AUREUS BCID: NOT DETECTED
STAPHYLOCOCCUS SPECIES: NOT DETECTED
STREPTOCOCCUS PNEUMONIAE: NOT DETECTED
Serratia marcescens: NOT DETECTED
Streptococcus agalactiae: NOT DETECTED
Streptococcus pyogenes: NOT DETECTED
Streptococcus species: NOT DETECTED

## 2017-11-25 ENCOUNTER — Encounter (HOSPITAL_COMMUNITY): Payer: Medicare Other

## 2017-11-26 ENCOUNTER — Other Ambulatory Visit (HOSPITAL_COMMUNITY): Payer: Medicare Other

## 2017-11-26 LAB — CULTURE, BLOOD (ROUTINE X 2): SPECIAL REQUESTS: ADEQUATE

## 2017-11-27 ENCOUNTER — Encounter (HOSPITAL_COMMUNITY): Payer: Medicare Other

## 2017-11-28 LAB — CULTURE, BLOOD (ROUTINE X 2)
CULTURE: NO GROWTH
SPECIAL REQUESTS: ADEQUATE

## 2017-12-01 ENCOUNTER — Other Ambulatory Visit (HOSPITAL_COMMUNITY): Payer: Medicare Other

## 2017-12-02 ENCOUNTER — Encounter (HOSPITAL_COMMUNITY): Payer: Medicare Other

## 2017-12-03 ENCOUNTER — Ambulatory Visit: Payer: Medicare Other | Admitting: Internal Medicine

## 2017-12-03 ENCOUNTER — Other Ambulatory Visit (HOSPITAL_COMMUNITY): Payer: Medicare Other

## 2017-12-04 ENCOUNTER — Encounter (HOSPITAL_COMMUNITY): Payer: Medicare Other

## 2017-12-08 ENCOUNTER — Other Ambulatory Visit (HOSPITAL_COMMUNITY): Payer: Medicare Other

## 2017-12-09 ENCOUNTER — Encounter (HOSPITAL_COMMUNITY): Payer: Medicare Other

## 2017-12-10 ENCOUNTER — Ambulatory Visit (HOSPITAL_COMMUNITY): Payer: Medicare Other | Admitting: Hematology

## 2017-12-10 ENCOUNTER — Other Ambulatory Visit (HOSPITAL_COMMUNITY): Payer: Medicare Other

## 2017-12-11 ENCOUNTER — Encounter (HOSPITAL_COMMUNITY): Payer: Medicare Other

## 2017-12-22 DIAGNOSIS — D61810 Antineoplastic chemotherapy induced pancytopenia: Secondary | ICD-10-CM | POA: Diagnosis not present

## 2017-12-22 DIAGNOSIS — C9310 Chronic myelomonocytic leukemia not having achieved remission: Secondary | ICD-10-CM | POA: Diagnosis not present

## 2017-12-22 DIAGNOSIS — C9200 Acute myeloblastic leukemia, not having achieved remission: Secondary | ICD-10-CM | POA: Diagnosis not present

## 2017-12-24 DIAGNOSIS — C9200 Acute myeloblastic leukemia, not having achieved remission: Secondary | ICD-10-CM | POA: Diagnosis not present

## 2017-12-26 DIAGNOSIS — R0602 Shortness of breath: Secondary | ICD-10-CM | POA: Diagnosis not present

## 2017-12-26 DIAGNOSIS — I10 Essential (primary) hypertension: Secondary | ICD-10-CM | POA: Diagnosis not present

## 2017-12-26 DIAGNOSIS — F419 Anxiety disorder, unspecified: Secondary | ICD-10-CM | POA: Diagnosis not present

## 2017-12-26 DIAGNOSIS — C9200 Acute myeloblastic leukemia, not having achieved remission: Secondary | ICD-10-CM | POA: Diagnosis not present

## 2017-12-26 DIAGNOSIS — N179 Acute kidney failure, unspecified: Secondary | ICD-10-CM | POA: Diagnosis not present

## 2017-12-26 DIAGNOSIS — G8929 Other chronic pain: Secondary | ICD-10-CM | POA: Diagnosis not present

## 2017-12-26 DIAGNOSIS — M25552 Pain in left hip: Secondary | ICD-10-CM | POA: Diagnosis not present

## 2017-12-26 DIAGNOSIS — K219 Gastro-esophageal reflux disease without esophagitis: Secondary | ICD-10-CM | POA: Diagnosis not present

## 2017-12-26 DIAGNOSIS — K5900 Constipation, unspecified: Secondary | ICD-10-CM | POA: Diagnosis not present

## 2017-12-26 DIAGNOSIS — L03311 Cellulitis of abdominal wall: Secondary | ICD-10-CM | POA: Diagnosis not present

## 2017-12-26 DIAGNOSIS — T80319A ABO incompatibility with hemolytic transfusion reaction, unspecified, initial encounter: Secondary | ICD-10-CM | POA: Diagnosis not present

## 2017-12-26 DIAGNOSIS — M25551 Pain in right hip: Secondary | ICD-10-CM | POA: Diagnosis not present

## 2017-12-26 DIAGNOSIS — R32 Unspecified urinary incontinence: Secondary | ICD-10-CM | POA: Diagnosis not present

## 2017-12-26 DIAGNOSIS — F329 Major depressive disorder, single episode, unspecified: Secondary | ICD-10-CM | POA: Diagnosis not present

## 2017-12-26 DIAGNOSIS — E039 Hypothyroidism, unspecified: Secondary | ICD-10-CM | POA: Diagnosis not present

## 2017-12-26 DIAGNOSIS — J449 Chronic obstructive pulmonary disease, unspecified: Secondary | ICD-10-CM | POA: Diagnosis not present

## 2017-12-26 DIAGNOSIS — R110 Nausea: Secondary | ICD-10-CM | POA: Diagnosis not present

## 2017-12-26 DIAGNOSIS — D693 Immune thrombocytopenic purpura: Secondary | ICD-10-CM | POA: Diagnosis not present

## 2017-12-26 DIAGNOSIS — Z79899 Other long term (current) drug therapy: Secondary | ICD-10-CM | POA: Diagnosis not present

## 2017-12-26 DIAGNOSIS — C9201 Acute myeloblastic leukemia, in remission: Secondary | ICD-10-CM | POA: Diagnosis not present

## 2017-12-29 DIAGNOSIS — D696 Thrombocytopenia, unspecified: Secondary | ICD-10-CM | POA: Diagnosis not present

## 2017-12-29 DIAGNOSIS — C9200 Acute myeloblastic leukemia, not having achieved remission: Secondary | ICD-10-CM | POA: Diagnosis not present

## 2017-12-31 DIAGNOSIS — T80319A ABO incompatibility with hemolytic transfusion reaction, unspecified, initial encounter: Secondary | ICD-10-CM | POA: Diagnosis not present

## 2017-12-31 DIAGNOSIS — D693 Immune thrombocytopenic purpura: Secondary | ICD-10-CM | POA: Diagnosis not present

## 2017-12-31 DIAGNOSIS — C9200 Acute myeloblastic leukemia, not having achieved remission: Secondary | ICD-10-CM | POA: Diagnosis not present

## 2018-01-01 DIAGNOSIS — C9200 Acute myeloblastic leukemia, not having achieved remission: Secondary | ICD-10-CM | POA: Diagnosis not present

## 2018-01-01 DIAGNOSIS — D693 Immune thrombocytopenic purpura: Secondary | ICD-10-CM | POA: Diagnosis not present

## 2018-01-03 DIAGNOSIS — D693 Immune thrombocytopenic purpura: Secondary | ICD-10-CM | POA: Diagnosis not present

## 2018-01-03 DIAGNOSIS — C9200 Acute myeloblastic leukemia, not having achieved remission: Secondary | ICD-10-CM | POA: Diagnosis not present

## 2018-01-05 DIAGNOSIS — Z79899 Other long term (current) drug therapy: Secondary | ICD-10-CM | POA: Diagnosis not present

## 2018-01-05 DIAGNOSIS — C9201 Acute myeloblastic leukemia, in remission: Secondary | ICD-10-CM | POA: Diagnosis not present

## 2018-01-06 DIAGNOSIS — T8039XA Other ABO incompatibility reaction due to transfusion of blood or blood products, initial encounter: Secondary | ICD-10-CM | POA: Diagnosis not present

## 2018-01-06 DIAGNOSIS — C9200 Acute myeloblastic leukemia, not having achieved remission: Secondary | ICD-10-CM | POA: Diagnosis not present

## 2018-01-07 DIAGNOSIS — C9200 Acute myeloblastic leukemia, not having achieved remission: Secondary | ICD-10-CM | POA: Diagnosis not present

## 2018-01-09 DIAGNOSIS — F419 Anxiety disorder, unspecified: Secondary | ICD-10-CM | POA: Diagnosis not present

## 2018-01-09 DIAGNOSIS — J449 Chronic obstructive pulmonary disease, unspecified: Secondary | ICD-10-CM | POA: Diagnosis not present

## 2018-01-09 DIAGNOSIS — R110 Nausea: Secondary | ICD-10-CM | POA: Diagnosis not present

## 2018-01-09 DIAGNOSIS — F329 Major depressive disorder, single episode, unspecified: Secondary | ICD-10-CM | POA: Diagnosis not present

## 2018-01-09 DIAGNOSIS — M76892 Other specified enthesopathies of left lower limb, excluding foot: Secondary | ICD-10-CM | POA: Diagnosis not present

## 2018-01-09 DIAGNOSIS — R51 Headache: Secondary | ICD-10-CM | POA: Diagnosis not present

## 2018-01-09 DIAGNOSIS — R509 Fever, unspecified: Secondary | ICD-10-CM | POA: Diagnosis not present

## 2018-01-09 DIAGNOSIS — F418 Other specified anxiety disorders: Secondary | ICD-10-CM | POA: Diagnosis not present

## 2018-01-09 DIAGNOSIS — D696 Thrombocytopenia, unspecified: Secondary | ICD-10-CM | POA: Diagnosis not present

## 2018-01-09 DIAGNOSIS — R040 Epistaxis: Secondary | ICD-10-CM | POA: Diagnosis not present

## 2018-01-09 DIAGNOSIS — K5900 Constipation, unspecified: Secondary | ICD-10-CM | POA: Diagnosis not present

## 2018-01-09 DIAGNOSIS — I10 Essential (primary) hypertension: Secondary | ICD-10-CM | POA: Diagnosis not present

## 2018-01-09 DIAGNOSIS — I4892 Unspecified atrial flutter: Secondary | ICD-10-CM | POA: Diagnosis not present

## 2018-01-09 DIAGNOSIS — R600 Localized edema: Secondary | ICD-10-CM | POA: Diagnosis not present

## 2018-01-09 DIAGNOSIS — C9200 Acute myeloblastic leukemia, not having achieved remission: Secondary | ICD-10-CM | POA: Diagnosis not present

## 2018-01-09 DIAGNOSIS — E039 Hypothyroidism, unspecified: Secondary | ICD-10-CM | POA: Diagnosis not present

## 2018-01-09 DIAGNOSIS — K219 Gastro-esophageal reflux disease without esophagitis: Secondary | ICD-10-CM | POA: Diagnosis not present

## 2018-01-09 DIAGNOSIS — C9310 Chronic myelomonocytic leukemia not having achieved remission: Secondary | ICD-10-CM | POA: Diagnosis not present

## 2018-01-09 DIAGNOSIS — I959 Hypotension, unspecified: Secondary | ICD-10-CM | POA: Diagnosis not present

## 2018-01-09 DIAGNOSIS — D693 Immune thrombocytopenic purpura: Secondary | ICD-10-CM | POA: Diagnosis not present

## 2018-01-09 DIAGNOSIS — M76891 Other specified enthesopathies of right lower limb, excluding foot: Secondary | ICD-10-CM | POA: Diagnosis not present

## 2018-01-12 DIAGNOSIS — C9310 Chronic myelomonocytic leukemia not having achieved remission: Secondary | ICD-10-CM | POA: Diagnosis not present

## 2018-01-12 DIAGNOSIS — D693 Immune thrombocytopenic purpura: Secondary | ICD-10-CM | POA: Diagnosis not present

## 2018-01-14 DIAGNOSIS — C9200 Acute myeloblastic leukemia, not having achieved remission: Secondary | ICD-10-CM | POA: Diagnosis not present

## 2018-01-16 DIAGNOSIS — C9200 Acute myeloblastic leukemia, not having achieved remission: Secondary | ICD-10-CM | POA: Diagnosis not present

## 2018-01-16 DIAGNOSIS — I10 Essential (primary) hypertension: Secondary | ICD-10-CM | POA: Diagnosis not present

## 2018-01-16 DIAGNOSIS — M25551 Pain in right hip: Secondary | ICD-10-CM | POA: Diagnosis not present

## 2018-01-16 DIAGNOSIS — J449 Chronic obstructive pulmonary disease, unspecified: Secondary | ICD-10-CM | POA: Diagnosis not present

## 2018-01-16 DIAGNOSIS — D693 Immune thrombocytopenic purpura: Secondary | ICD-10-CM | POA: Diagnosis not present

## 2018-01-16 DIAGNOSIS — I959 Hypotension, unspecified: Secondary | ICD-10-CM | POA: Diagnosis not present

## 2018-01-16 DIAGNOSIS — F419 Anxiety disorder, unspecified: Secondary | ICD-10-CM | POA: Diagnosis not present

## 2018-01-16 DIAGNOSIS — R51 Headache: Secondary | ICD-10-CM | POA: Diagnosis not present

## 2018-01-16 DIAGNOSIS — K219 Gastro-esophageal reflux disease without esophagitis: Secondary | ICD-10-CM | POA: Diagnosis not present

## 2018-01-16 DIAGNOSIS — C9250 Acute myelomonocytic leukemia, not having achieved remission: Secondary | ICD-10-CM | POA: Diagnosis not present

## 2018-01-16 DIAGNOSIS — F329 Major depressive disorder, single episode, unspecified: Secondary | ICD-10-CM | POA: Diagnosis not present

## 2018-01-16 DIAGNOSIS — N179 Acute kidney failure, unspecified: Secondary | ICD-10-CM | POA: Diagnosis not present

## 2018-01-16 DIAGNOSIS — G8929 Other chronic pain: Secondary | ICD-10-CM | POA: Diagnosis not present

## 2018-01-16 DIAGNOSIS — R600 Localized edema: Secondary | ICD-10-CM | POA: Diagnosis not present

## 2018-01-16 DIAGNOSIS — M25552 Pain in left hip: Secondary | ICD-10-CM | POA: Diagnosis not present

## 2018-01-16 DIAGNOSIS — I4892 Unspecified atrial flutter: Secondary | ICD-10-CM | POA: Diagnosis not present

## 2018-01-16 DIAGNOSIS — D696 Thrombocytopenia, unspecified: Secondary | ICD-10-CM | POA: Diagnosis not present

## 2018-01-16 DIAGNOSIS — E039 Hypothyroidism, unspecified: Secondary | ICD-10-CM | POA: Diagnosis not present

## 2018-01-16 DIAGNOSIS — K5900 Constipation, unspecified: Secondary | ICD-10-CM | POA: Diagnosis not present

## 2018-01-16 DIAGNOSIS — Z79899 Other long term (current) drug therapy: Secondary | ICD-10-CM | POA: Diagnosis not present

## 2018-01-16 DIAGNOSIS — R110 Nausea: Secondary | ICD-10-CM | POA: Diagnosis not present

## 2018-01-19 DIAGNOSIS — C9310 Chronic myelomonocytic leukemia not having achieved remission: Secondary | ICD-10-CM | POA: Diagnosis not present

## 2018-01-19 DIAGNOSIS — C9200 Acute myeloblastic leukemia, not having achieved remission: Secondary | ICD-10-CM | POA: Diagnosis not present

## 2018-01-19 DIAGNOSIS — D61818 Other pancytopenia: Secondary | ICD-10-CM | POA: Diagnosis not present

## 2018-01-22 DIAGNOSIS — M24151 Other articular cartilage disorders, right hip: Secondary | ICD-10-CM | POA: Diagnosis not present

## 2018-01-22 DIAGNOSIS — C9200 Acute myeloblastic leukemia, not having achieved remission: Secondary | ICD-10-CM | POA: Diagnosis not present

## 2018-01-22 DIAGNOSIS — M24152 Other articular cartilage disorders, left hip: Secondary | ICD-10-CM | POA: Diagnosis not present

## 2018-01-22 DIAGNOSIS — D696 Thrombocytopenia, unspecified: Secondary | ICD-10-CM | POA: Diagnosis not present

## 2018-01-22 DIAGNOSIS — K219 Gastro-esophageal reflux disease without esophagitis: Secondary | ICD-10-CM | POA: Diagnosis not present

## 2018-01-22 DIAGNOSIS — K5900 Constipation, unspecified: Secondary | ICD-10-CM | POA: Diagnosis not present

## 2018-01-22 DIAGNOSIS — R110 Nausea: Secondary | ICD-10-CM | POA: Diagnosis not present

## 2018-01-22 DIAGNOSIS — J449 Chronic obstructive pulmonary disease, unspecified: Secondary | ICD-10-CM | POA: Diagnosis not present

## 2018-01-22 DIAGNOSIS — E039 Hypothyroidism, unspecified: Secondary | ICD-10-CM | POA: Diagnosis not present

## 2018-01-22 DIAGNOSIS — R630 Anorexia: Secondary | ICD-10-CM | POA: Diagnosis not present

## 2018-01-22 DIAGNOSIS — N179 Acute kidney failure, unspecified: Secondary | ICD-10-CM | POA: Diagnosis not present

## 2018-01-22 DIAGNOSIS — F418 Other specified anxiety disorders: Secondary | ICD-10-CM | POA: Diagnosis not present

## 2018-01-22 DIAGNOSIS — R609 Edema, unspecified: Secondary | ICD-10-CM | POA: Diagnosis not present

## 2018-01-22 DIAGNOSIS — R32 Unspecified urinary incontinence: Secondary | ICD-10-CM | POA: Diagnosis not present

## 2018-01-22 DIAGNOSIS — R51 Headache: Secondary | ICD-10-CM | POA: Diagnosis not present

## 2018-01-24 DIAGNOSIS — Z792 Long term (current) use of antibiotics: Secondary | ICD-10-CM | POA: Diagnosis not present

## 2018-01-24 DIAGNOSIS — Z79899 Other long term (current) drug therapy: Secondary | ICD-10-CM | POA: Diagnosis not present

## 2018-01-24 DIAGNOSIS — C9200 Acute myeloblastic leukemia, not having achieved remission: Secondary | ICD-10-CM | POA: Diagnosis not present

## 2018-01-26 DIAGNOSIS — C9200 Acute myeloblastic leukemia, not having achieved remission: Secondary | ICD-10-CM | POA: Diagnosis not present

## 2018-01-28 ENCOUNTER — Encounter (HOSPITAL_COMMUNITY): Payer: Self-pay

## 2018-01-28 ENCOUNTER — Emergency Department (HOSPITAL_COMMUNITY)
Admission: EM | Admit: 2018-01-28 | Discharge: 2018-01-28 | Disposition: A | Discharge status: Home / Self Care | Payer: Medicare Other | Attending: Emergency Medicine | Admitting: Emergency Medicine

## 2018-01-28 DIAGNOSIS — I129 Hypertensive chronic kidney disease with stage 1 through stage 4 chronic kidney disease, or unspecified chronic kidney disease: Secondary | ICD-10-CM | POA: Insufficient documentation

## 2018-01-28 DIAGNOSIS — J189 Pneumonia, unspecified organism: Secondary | ICD-10-CM | POA: Diagnosis not present

## 2018-01-28 DIAGNOSIS — R0902 Hypoxemia: Secondary | ICD-10-CM | POA: Diagnosis not present

## 2018-01-28 DIAGNOSIS — L089 Local infection of the skin and subcutaneous tissue, unspecified: Secondary | ICD-10-CM | POA: Diagnosis not present

## 2018-01-28 DIAGNOSIS — Z7901 Long term (current) use of anticoagulants: Secondary | ICD-10-CM | POA: Insufficient documentation

## 2018-01-28 DIAGNOSIS — R9431 Abnormal electrocardiogram [ECG] [EKG]: Secondary | ICD-10-CM | POA: Diagnosis not present

## 2018-01-28 DIAGNOSIS — K922 Gastrointestinal hemorrhage, unspecified: Secondary | ICD-10-CM | POA: Diagnosis not present

## 2018-01-28 DIAGNOSIS — D6959 Other secondary thrombocytopenia: Secondary | ICD-10-CM | POA: Diagnosis present

## 2018-01-28 DIAGNOSIS — B958 Unspecified staphylococcus as the cause of diseases classified elsewhere: Secondary | ICD-10-CM | POA: Diagnosis not present

## 2018-01-28 DIAGNOSIS — Z79899 Other long term (current) drug therapy: Secondary | ICD-10-CM | POA: Insufficient documentation

## 2018-01-28 DIAGNOSIS — D649 Anemia, unspecified: Secondary | ICD-10-CM | POA: Diagnosis not present

## 2018-01-28 DIAGNOSIS — R195 Other fecal abnormalities: Secondary | ICD-10-CM | POA: Diagnosis present

## 2018-01-28 DIAGNOSIS — R918 Other nonspecific abnormal finding of lung field: Secondary | ICD-10-CM | POA: Diagnosis not present

## 2018-01-28 DIAGNOSIS — Z515 Encounter for palliative care: Secondary | ICD-10-CM | POA: Diagnosis not present

## 2018-01-28 DIAGNOSIS — D65 Disseminated intravascular coagulation [defibrination syndrome]: Secondary | ICD-10-CM | POA: Diagnosis present

## 2018-01-28 DIAGNOSIS — E039 Hypothyroidism, unspecified: Secondary | ICD-10-CM | POA: Diagnosis not present

## 2018-01-28 DIAGNOSIS — R110 Nausea: Secondary | ICD-10-CM | POA: Diagnosis not present

## 2018-01-28 DIAGNOSIS — D696 Thrombocytopenia, unspecified: Secondary | ICD-10-CM

## 2018-01-28 DIAGNOSIS — Z66 Do not resuscitate: Secondary | ICD-10-CM | POA: Diagnosis not present

## 2018-01-28 DIAGNOSIS — K921 Melena: Secondary | ICD-10-CM | POA: Diagnosis not present

## 2018-01-28 DIAGNOSIS — N189 Chronic kidney disease, unspecified: Secondary | ICD-10-CM | POA: Diagnosis not present

## 2018-01-28 DIAGNOSIS — J9601 Acute respiratory failure with hypoxia: Secondary | ICD-10-CM | POA: Diagnosis not present

## 2018-01-28 DIAGNOSIS — R0602 Shortness of breath: Secondary | ICD-10-CM | POA: Diagnosis not present

## 2018-01-28 DIAGNOSIS — G4700 Insomnia, unspecified: Secondary | ICD-10-CM | POA: Diagnosis present

## 2018-01-28 DIAGNOSIS — Z833 Family history of diabetes mellitus: Secondary | ICD-10-CM | POA: Diagnosis not present

## 2018-01-28 DIAGNOSIS — I493 Ventricular premature depolarization: Secondary | ICD-10-CM | POA: Diagnosis not present

## 2018-01-28 DIAGNOSIS — K5900 Constipation, unspecified: Secondary | ICD-10-CM | POA: Diagnosis present

## 2018-01-28 DIAGNOSIS — I10 Essential (primary) hypertension: Secondary | ICD-10-CM | POA: Diagnosis not present

## 2018-01-28 DIAGNOSIS — C9250 Acute myelomonocytic leukemia, not having achieved remission: Secondary | ICD-10-CM | POA: Diagnosis present

## 2018-01-28 DIAGNOSIS — D500 Iron deficiency anemia secondary to blood loss (chronic): Secondary | ICD-10-CM | POA: Diagnosis not present

## 2018-01-28 DIAGNOSIS — R001 Bradycardia, unspecified: Secondary | ICD-10-CM | POA: Diagnosis not present

## 2018-01-28 DIAGNOSIS — Z809 Family history of malignant neoplasm, unspecified: Secondary | ICD-10-CM | POA: Diagnosis not present

## 2018-01-28 DIAGNOSIS — R509 Fever, unspecified: Secondary | ICD-10-CM | POA: Diagnosis not present

## 2018-01-28 DIAGNOSIS — I4891 Unspecified atrial fibrillation: Secondary | ICD-10-CM | POA: Diagnosis not present

## 2018-01-28 DIAGNOSIS — G8929 Other chronic pain: Secondary | ICD-10-CM | POA: Diagnosis present

## 2018-01-28 DIAGNOSIS — D61810 Antineoplastic chemotherapy induced pancytopenia: Secondary | ICD-10-CM | POA: Diagnosis not present

## 2018-01-28 DIAGNOSIS — C9200 Acute myeloblastic leukemia, not having achieved remission: Secondary | ICD-10-CM | POA: Diagnosis not present

## 2018-01-28 DIAGNOSIS — Z87891 Personal history of nicotine dependence: Secondary | ICD-10-CM | POA: Diagnosis not present

## 2018-01-28 DIAGNOSIS — R1111 Vomiting without nausea: Secondary | ICD-10-CM | POA: Diagnosis not present

## 2018-01-28 DIAGNOSIS — R112 Nausea with vomiting, unspecified: Secondary | ICD-10-CM | POA: Diagnosis not present

## 2018-01-28 DIAGNOSIS — D709 Neutropenia, unspecified: Secondary | ICD-10-CM | POA: Diagnosis present

## 2018-01-28 LAB — COMPREHENSIVE METABOLIC PANEL
ALT: 47 U/L — ABNORMAL HIGH (ref 0–44)
AST: 38 U/L (ref 15–41)
Albumin: 3.1 g/dL — ABNORMAL LOW (ref 3.5–5.0)
Alkaline Phosphatase: 181 U/L — ABNORMAL HIGH (ref 38–126)
Anion gap: 8 (ref 5–15)
BUN: 57 mg/dL — ABNORMAL HIGH (ref 8–23)
CO2: 22 mmol/L (ref 22–32)
Calcium: 9.1 mg/dL (ref 8.9–10.3)
Chloride: 108 mmol/L (ref 98–111)
Creatinine, Ser: 1.68 mg/dL — ABNORMAL HIGH (ref 0.44–1.00)
GFR calc Af Amer: 34 mL/min — ABNORMAL LOW (ref >60)
GFR calc non Af Amer: 30 mL/min — ABNORMAL LOW (ref >60)
Glucose, Bld: 136 mg/dL — ABNORMAL HIGH (ref 70–99)
Potassium: 3.7 mmol/L (ref 3.5–5.1)
SODIUM: 138 mmol/L (ref 135–145)
Total Bilirubin: 1 mg/dL (ref 0.3–1.2)
Total Protein: 6.3 g/dL — ABNORMAL LOW (ref 6.5–8.1)

## 2018-01-28 LAB — PROTIME-INR
INR: 1.45
Prothrombin Time: 17.5 seconds — ABNORMAL HIGH (ref 11.4–15.2)

## 2018-01-28 LAB — CBC WITH DIFFERENTIAL/PLATELET
Abs Immature Granulocytes: 1.01 10*3/uL — ABNORMAL HIGH (ref 0.00–0.07)
BASOS ABS: 0 10*3/uL (ref 0.0–0.1)
Basophils Relative: 0 %
Eosinophils Absolute: 0 10*3/uL (ref 0.0–0.5)
Eosinophils Relative: 0 %
HCT: 21.3 % — ABNORMAL LOW (ref 36.0–46.0)
Hemoglobin: 6.7 g/dL — CL (ref 12.0–15.0)
Immature Granulocytes: 2 %
LYMPHS PCT: 3 %
Lymphs Abs: 1.7 10*3/uL (ref 0.7–4.0)
MCH: 29.9 pg (ref 26.0–34.0)
MCHC: 31.5 g/dL (ref 30.0–36.0)
MCV: 95.1 fL (ref 80.0–100.0)
Monocytes Absolute: 50.8 10*3/uL — ABNORMAL HIGH (ref 0.1–1.0)
Monocytes Relative: 92 %
Neutro Abs: 1.8 10*3/uL (ref 1.7–7.7)
Neutrophils Relative %: 3 %
Platelets: 4 10*3/uL — CL (ref 150–400)
RBC: 2.24 MIL/uL — ABNORMAL LOW (ref 3.87–5.11)
RDW: 17.2 % — ABNORMAL HIGH (ref 11.5–15.5)
WBC: 55.4 10*3/uL (ref 4.0–10.5)
nRBC: 0 % (ref 0.0–0.2)

## 2018-01-28 LAB — TYPE AND SCREEN
ABO/RH(D): A NEG
Antibody Screen: POSITIVE
DAT, IgG: POSITIVE

## 2018-01-28 NOTE — ED Notes (Signed)
Date and time results received: 01/28/18 0630 (use smartphrase ".now" to insert current time)  Test: Hgb Critical Value: 6.7  Name of Provider Notified: Dr Betsey Holiday  Orders Received? Or Actions Taken?:

## 2018-01-28 NOTE — ED Notes (Signed)
Pt c/o several episodes of dark stools and ?blood in her emesis since yesterday,

## 2018-01-28 NOTE — ED Triage Notes (Signed)
Pt arrives via ems from home, states she has had blood in her emesis since yesterday and dark stools, but the dark stool has been going on for a long time.  Pt denies pain, states she "just got scared tonight and called ems"
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
## 2018-01-28 NOTE — ED Notes (Signed)
Spoke with Christina Bentley at [facility]. He will notify dispatch for transportation and call [facility] back

## 2018-01-28 NOTE — ED Notes (Signed)
Date and time results received: 01/28/18 0630 (use smartphrase ".now" to insert current time)  Test: Platelets Critical Value: 4.0  Name of Provider Notified: Dr. Betsey Holiday  Orders Received? Or Actions Taken?:

## 2018-01-28 NOTE — ED Notes (Addendum)
Pt assisted up o [facility] pt had approx small black stool. approx 2 tbsp

## 2018-01-28 NOTE — ED Notes (Signed)
Spoke with Christina Bentley with [facility] transport. Reports given

## 2018-01-28 NOTE — ED Notes (Signed)
Date and time results received: 01/28/18 0630 (use smartphrase ".now" to insert current time)  Test: WBC Critical Value: 55.4  Name of Provider Notified: Dr. Betsey Holiday  Orders Received? Or Actions Taken?:

## 2018-02-28 DEATH — deceased
